# Patient Record
Sex: Male | Born: 1975
Health system: Southern US, Community
[De-identification: ages and names within clinical notes are randomized; demographics above are authoritative.]

## PROBLEM LIST (undated history)

## (undated) DIAGNOSIS — F419 Anxiety disorder, unspecified: Secondary | ICD-10-CM

## (undated) DIAGNOSIS — T8859XA Other complications of anesthesia, initial encounter: Secondary | ICD-10-CM

## (undated) DIAGNOSIS — T4145XA Adverse effect of unspecified anesthetic, initial encounter: Secondary | ICD-10-CM

## (undated) DIAGNOSIS — Z789 Other specified health status: Secondary | ICD-10-CM

## (undated) DIAGNOSIS — R112 Nausea with vomiting, unspecified: Secondary | ICD-10-CM

## (undated) DIAGNOSIS — Z9889 Other specified postprocedural states: Secondary | ICD-10-CM

## (undated) HISTORY — PX: APPENDECTOMY: SHX54

## (undated) HISTORY — DX: Anxiety disorder, unspecified: F41.9

## (undated) HISTORY — PX: WISDOM TOOTH EXTRACTION: SHX21

---

## 2012-04-14 ENCOUNTER — Emergency Department (HOSPITAL_BASED_OUTPATIENT_CLINIC_OR_DEPARTMENT_OTHER): Payer: Managed Care, Other (non HMO)

## 2012-04-14 ENCOUNTER — Encounter (HOSPITAL_BASED_OUTPATIENT_CLINIC_OR_DEPARTMENT_OTHER): Payer: Self-pay

## 2012-04-14 ENCOUNTER — Observation Stay (HOSPITAL_BASED_OUTPATIENT_CLINIC_OR_DEPARTMENT_OTHER)
Admission: EM | Admit: 2012-04-14 | Discharge: 2012-04-16 | Disposition: A | Discharge status: Home / Self Care | Payer: Managed Care, Other (non HMO) | Attending: General Surgery | Admitting: General Surgery

## 2012-04-14 DIAGNOSIS — R0609 Other forms of dyspnea: Secondary | ICD-10-CM | POA: Insufficient documentation

## 2012-04-14 DIAGNOSIS — K37 Unspecified appendicitis: Secondary | ICD-10-CM

## 2012-04-14 DIAGNOSIS — R42 Dizziness and giddiness: Secondary | ICD-10-CM | POA: Insufficient documentation

## 2012-04-14 DIAGNOSIS — R0989 Other specified symptoms and signs involving the circulatory and respiratory systems: Secondary | ICD-10-CM | POA: Insufficient documentation

## 2012-04-14 DIAGNOSIS — K3580 Unspecified acute appendicitis: Principal | ICD-10-CM | POA: Insufficient documentation

## 2012-04-14 HISTORY — DX: Adverse effect of unspecified anesthetic, initial encounter: T41.45XA

## 2012-04-14 HISTORY — DX: Other specified health status: Z78.9

## 2012-04-14 HISTORY — DX: Other specified postprocedural states: Z98.890

## 2012-04-14 HISTORY — DX: Other complications of anesthesia, initial encounter: T88.59XA

## 2012-04-14 HISTORY — DX: Nausea with vomiting, unspecified: R11.2

## 2012-04-14 LAB — CBC WITH DIFFERENTIAL/PLATELET
Basophils Absolute: 0.1 10*3/uL (ref 0.0–0.1)
HCT: 42 % (ref 39.0–52.0)
Lymphocytes Relative: 11 % — ABNORMAL LOW (ref 12–46)
Lymphs Abs: 1.9 10*3/uL (ref 0.7–4.0)
MCV: 90.3 fL (ref 78.0–100.0)
Monocytes Absolute: 1 10*3/uL (ref 0.1–1.0)
Neutro Abs: 14.2 10*3/uL — ABNORMAL HIGH (ref 1.7–7.7)
Platelets: 207 10*3/uL (ref 150–400)
RBC: 4.65 MIL/uL (ref 4.22–5.81)
RDW: 12.6 % (ref 11.5–15.5)
WBC: 17.2 10*3/uL — ABNORMAL HIGH (ref 4.0–10.5)

## 2012-04-14 LAB — URINALYSIS, ROUTINE W REFLEX MICROSCOPIC
Glucose, UA: NEGATIVE mg/dL
Hgb urine dipstick: NEGATIVE
Leukocytes, UA: NEGATIVE
Protein, ur: NEGATIVE mg/dL
Specific Gravity, Urine: 1.027 (ref 1.005–1.030)

## 2012-04-14 LAB — BASIC METABOLIC PANEL
CO2: 25 mEq/L (ref 19–32)
Chloride: 104 mEq/L (ref 96–112)
Glucose, Bld: 97 mg/dL (ref 70–99)
Sodium: 140 mEq/L (ref 135–145)

## 2012-04-14 LAB — HEPATIC FUNCTION PANEL
ALT: 24 U/L (ref 0–53)
Albumin: 4.4 g/dL (ref 3.5–5.2)
Alkaline Phosphatase: 47 U/L (ref 39–117)
Total Bilirubin: 0.2 mg/dL — ABNORMAL LOW (ref 0.3–1.2)
Total Protein: 7.3 g/dL (ref 6.0–8.3)

## 2012-04-14 MED ORDER — FAMOTIDINE IN NACL 20-0.9 MG/50ML-% IV SOLN
20.0000 mg | Freq: Once | INTRAVENOUS | Status: AC
  Administered 2012-04-14: 20 mg via INTRAVENOUS
  Filled 2012-04-14: qty 50

## 2012-04-14 MED ORDER — IOHEXOL 300 MG/ML  SOLN
20.0000 mL | Freq: Once | INTRAMUSCULAR | Status: AC | PRN
  Administered 2012-04-14: 20 mL via ORAL

## 2012-04-14 MED ORDER — HYDROMORPHONE HCL PF 1 MG/ML IJ SOLN
1.0000 mg | Freq: Once | INTRAMUSCULAR | Status: AC
  Administered 2012-04-14: 1 mg via INTRAVENOUS
  Filled 2012-04-14: qty 1

## 2012-04-14 MED ORDER — SODIUM CHLORIDE 0.9 % IV BOLUS (SEPSIS)
1000.0000 mL | Freq: Once | INTRAVENOUS | Status: AC
  Administered 2012-04-14: 1000 mL via INTRAVENOUS

## 2012-04-14 MED ORDER — IOHEXOL 300 MG/ML  SOLN
100.0000 mL | Freq: Once | INTRAMUSCULAR | Status: AC | PRN
  Administered 2012-04-14: 100 mL via INTRAVENOUS

## 2012-04-14 MED ORDER — ONDANSETRON HCL 4 MG/2ML IJ SOLN
4.0000 mg | Freq: Once | INTRAMUSCULAR | Status: AC
  Administered 2012-04-14: 4 mg via INTRAVENOUS
  Filled 2012-04-14: qty 2

## 2012-04-14 MED ORDER — SODIUM CHLORIDE 0.9 % IV SOLN
Freq: Once | INTRAVENOUS | Status: AC
  Administered 2012-04-15: 01:00:00 via INTRAVENOUS

## 2012-04-14 NOTE — ED Notes (Signed)
C/o abd pain and "swelling" x 1 hour-last po intake 4pm

## 2012-04-14 NOTE — ED Notes (Signed)
Pt reports developing rlq pain today, reports that his abdomen is swollen and his lower extremities appear swollen, no edema noted, + bowel sounds, last bm today. Pt reports vomiting once today.

## 2012-04-14 NOTE — ED Notes (Signed)
Patient transported to Ultrasound 

## 2012-04-14 NOTE — ED Notes (Signed)
MD at bedside. 

## 2012-04-15 ENCOUNTER — Encounter (HOSPITAL_COMMUNITY)
Admission: EM | Disposition: A | Discharge status: Home / Self Care | Payer: Self-pay | Source: Home / Self Care | Attending: Emergency Medicine

## 2012-04-15 ENCOUNTER — Encounter (HOSPITAL_COMMUNITY): Payer: Self-pay | Admitting: *Deleted

## 2012-04-15 ENCOUNTER — Observation Stay (HOSPITAL_COMMUNITY): Payer: Managed Care, Other (non HMO) | Admitting: Anesthesiology

## 2012-04-15 ENCOUNTER — Encounter (HOSPITAL_COMMUNITY): Payer: Self-pay | Admitting: Anesthesiology

## 2012-04-15 DIAGNOSIS — K37 Unspecified appendicitis: Secondary | ICD-10-CM | POA: Diagnosis present

## 2012-04-15 DIAGNOSIS — K3580 Unspecified acute appendicitis: Secondary | ICD-10-CM

## 2012-04-15 HISTORY — PX: LAPAROSCOPIC APPENDECTOMY: SHX408

## 2012-04-15 SURGERY — APPENDECTOMY, LAPAROSCOPIC
Anesthesia: General | Site: Abdomen | Wound class: Contaminated

## 2012-04-15 MED ORDER — ACETAMINOPHEN 650 MG RE SUPP: 650.0000 mg | Freq: Four times a day (QID) | RECTAL | Status: DC | PRN

## 2012-04-15 MED ORDER — CISATRACURIUM BESYLATE (PF) 10 MG/5ML IV SOLN
INTRAVENOUS | Status: DC | PRN
  Administered 2012-04-15: 10 mg via INTRAVENOUS

## 2012-04-15 MED ORDER — ALUM & MAG HYDROXIDE-SIMETH 200-200-20 MG/5ML PO SUSP: 30.0000 mL | Freq: Four times a day (QID) | ORAL | Status: DC | PRN

## 2012-04-15 MED ORDER — LIP MEDEX EX OINT
1.0000 "application " | TOPICAL_OINTMENT | Freq: Two times a day (BID) | CUTANEOUS | Status: DC
  Administered 2012-04-15 – 2012-04-16 (×2): 1 via TOPICAL
  Filled 2012-04-15: qty 7

## 2012-04-15 MED ORDER — ONDANSETRON HCL 4 MG/2ML IJ SOLN
INTRAMUSCULAR | Status: DC | PRN
  Administered 2012-04-15: 4 mg via INTRAVENOUS

## 2012-04-15 MED ORDER — PROMETHAZINE HCL 25 MG/ML IJ SOLN
12.5000 mg | Freq: Four times a day (QID) | INTRAMUSCULAR | Status: DC | PRN
  Filled 2012-04-15: qty 1

## 2012-04-15 MED ORDER — BUPIVACAINE-EPINEPHRINE 0.25% -1:200000 IJ SOLN
INTRAMUSCULAR | Status: DC | PRN
  Administered 2012-04-15: 50 mL

## 2012-04-15 MED ORDER — PROMETHAZINE HCL 25 MG/ML IJ SOLN
6.2500 mg | Freq: Four times a day (QID) | INTRAMUSCULAR | Status: DC | PRN
  Administered 2012-04-15: 6.25 mg via INTRAVENOUS
  Filled 2012-04-15: qty 1

## 2012-04-15 MED ORDER — LACTATED RINGERS IV SOLN
INTRAVENOUS | Status: DC | PRN
  Administered 2012-04-15: 06:00:00 via INTRAVENOUS

## 2012-04-15 MED ORDER — HYDROMORPHONE HCL PF 1 MG/ML IJ SOLN
INTRAMUSCULAR | Status: AC
  Filled 2012-04-15: qty 1

## 2012-04-15 MED ORDER — OXYCODONE HCL 5 MG PO TABS: 5.0000 mg | ORAL_TABLET | ORAL | Status: DC | PRN

## 2012-04-15 MED ORDER — MAGNESIUM HYDROXIDE 400 MG/5ML PO SUSP: 30.0000 mL | Freq: Two times a day (BID) | ORAL | Status: DC | PRN

## 2012-04-15 MED ORDER — DEXTROSE 5 % IV SOLN
2.0000 g | Freq: Four times a day (QID) | INTRAVENOUS | Status: DC
  Administered 2012-04-15: 2 g via INTRAVENOUS
  Filled 2012-04-15 (×2): qty 2

## 2012-04-15 MED ORDER — SUCCINYLCHOLINE CHLORIDE 20 MG/ML IJ SOLN
INTRAMUSCULAR | Status: DC | PRN
  Administered 2012-04-15: 140 mg via INTRAVENOUS

## 2012-04-15 MED ORDER — ACETAMINOPHEN 10 MG/ML IV SOLN
INTRAVENOUS | Status: AC
  Filled 2012-04-15: qty 100

## 2012-04-15 MED ORDER — BISACODYL 10 MG RE SUPP: 10.0000 mg | Freq: Two times a day (BID) | RECTAL | Status: DC | PRN

## 2012-04-15 MED ORDER — SODIUM CHLORIDE 0.9 % IJ SOLN: 3.0000 mL | INTRAMUSCULAR | Status: DC | PRN

## 2012-04-15 MED ORDER — HYDROMORPHONE HCL PF 1 MG/ML IJ SOLN
0.5000 mg | INTRAMUSCULAR | Status: DC | PRN
  Administered 2012-04-15: 1 mg via INTRAVENOUS
  Filled 2012-04-15: qty 1

## 2012-04-15 MED ORDER — SODIUM CHLORIDE 0.9 % IJ SOLN: 3.0000 mL | Freq: Two times a day (BID) | INTRAMUSCULAR | Status: DC

## 2012-04-15 MED ORDER — NEOSTIGMINE METHYLSULFATE 1 MG/ML IJ SOLN
INTRAMUSCULAR | Status: DC | PRN
  Administered 2012-04-15: 5 mg via INTRAVENOUS

## 2012-04-15 MED ORDER — HYDROMORPHONE HCL PF 1 MG/ML IJ SOLN
0.2500 mg | INTRAMUSCULAR | Status: DC | PRN
  Administered 2012-04-15 (×2): 0.25 mg via INTRAVENOUS
  Administered 2012-04-15: 0.5 mg via INTRAVENOUS

## 2012-04-15 MED ORDER — ONDANSETRON HCL 4 MG/2ML IJ SOLN
4.0000 mg | Freq: Four times a day (QID) | INTRAMUSCULAR | Status: DC | PRN
  Administered 2012-04-15 (×2): 4 mg via INTRAVENOUS
  Filled 2012-04-15 (×2): qty 2

## 2012-04-15 MED ORDER — DIPHENHYDRAMINE HCL 50 MG/ML IJ SOLN
12.5000 mg | Freq: Four times a day (QID) | INTRAMUSCULAR | Status: DC | PRN
Start: 2012-04-15 — End: 2012-04-16

## 2012-04-15 MED ORDER — HYDROMORPHONE HCL PF 1 MG/ML IJ SOLN
INTRAMUSCULAR | Status: AC
  Administered 2012-04-15: 1 mg via INTRAVENOUS
  Filled 2012-04-15: qty 1

## 2012-04-15 MED ORDER — LACTATED RINGERS IV BOLUS (SEPSIS): 1000.0000 mL | Freq: Three times a day (TID) | INTRAVENOUS | Status: DC | PRN

## 2012-04-15 MED ORDER — MIDAZOLAM HCL 5 MG/5ML IJ SOLN
INTRAMUSCULAR | Status: DC | PRN
  Administered 2012-04-15: 1.5 mg via INTRAVENOUS

## 2012-04-15 MED ORDER — ACETAMINOPHEN 10 MG/ML IV SOLN
1000.0000 mg | Freq: Four times a day (QID) | INTRAVENOUS | Status: DC
  Administered 2012-04-15 – 2012-04-16 (×3): 1000 mg via INTRAVENOUS
  Filled 2012-04-15 (×4): qty 100

## 2012-04-15 MED ORDER — ACETAMINOPHEN 325 MG PO TABS: 650.0000 mg | ORAL_TABLET | Freq: Four times a day (QID) | ORAL | Status: DC | PRN

## 2012-04-15 MED ORDER — BUPIVACAINE-EPINEPHRINE 0.25% -1:200000 IJ SOLN
INTRAMUSCULAR | Status: AC
  Filled 2012-04-15: qty 1

## 2012-04-15 MED ORDER — MAGIC MOUTHWASH
15.0000 mL | Freq: Four times a day (QID) | ORAL | Status: DC | PRN
  Filled 2012-04-15: qty 15

## 2012-04-15 MED ORDER — DEXTROSE IN LACTATED RINGERS 5 % IV SOLN
INTRAVENOUS | Status: DC
  Administered 2012-04-15 – 2012-04-16 (×4): via INTRAVENOUS

## 2012-04-15 MED ORDER — PROPOFOL 10 MG/ML IV EMUL
INTRAVENOUS | Status: DC | PRN
  Administered 2012-04-15: 200 mg via INTRAVENOUS

## 2012-04-15 MED ORDER — CEFOXITIN SODIUM-DEXTROSE 1-4 GM-% IV SOLR (PREMIX)
INTRAVENOUS | Status: AC
  Filled 2012-04-15: qty 100

## 2012-04-15 MED ORDER — IBUPROFEN 600 MG PO TABS
600.0000 mg | ORAL_TABLET | Freq: Three times a day (TID) | ORAL | Status: DC
  Administered 2012-04-15 – 2012-04-16 (×4): 600 mg via ORAL
  Filled 2012-04-15 (×8): qty 1

## 2012-04-15 MED ORDER — ACETAMINOPHEN 10 MG/ML IV SOLN
INTRAVENOUS | Status: DC | PRN
  Administered 2012-04-15: 1000 mg via INTRAVENOUS

## 2012-04-15 MED ORDER — PROMETHAZINE HCL 25 MG/ML IJ SOLN: 6.2500 mg | INTRAMUSCULAR | Status: DC | PRN

## 2012-04-15 MED ORDER — FENTANYL CITRATE 0.05 MG/ML IJ SOLN
INTRAMUSCULAR | Status: DC | PRN
  Administered 2012-04-15: 50 ug via INTRAVENOUS
  Administered 2012-04-15: 100 ug via INTRAVENOUS
  Administered 2012-04-15 (×2): 50 ug via INTRAVENOUS

## 2012-04-15 MED ORDER — DIPHENHYDRAMINE HCL 12.5 MG/5ML PO ELIX: 12.5000 mg | ORAL_SOLUTION | Freq: Four times a day (QID) | ORAL | Status: DC | PRN

## 2012-04-15 MED ORDER — GLYCOPYRROLATE 0.2 MG/ML IJ SOLN
INTRAMUSCULAR | Status: DC | PRN
  Administered 2012-04-15: .8 mg via INTRAVENOUS

## 2012-04-15 MED ORDER — LIDOCAINE HCL (CARDIAC) 20 MG/ML IV SOLN
INTRAVENOUS | Status: DC | PRN
  Administered 2012-04-15: 30 mg via INTRAVENOUS

## 2012-04-15 MED ORDER — DEXTROSE 5 % IV SOLN
2.0000 g | Freq: Three times a day (TID) | INTRAVENOUS | Status: DC
  Administered 2012-04-15 – 2012-04-16 (×4): 2 g via INTRAVENOUS
  Filled 2012-04-15 (×6): qty 2

## 2012-04-15 MED ORDER — HYDROMORPHONE HCL PF 1 MG/ML IJ SOLN
1.0000 mg | Freq: Once | INTRAMUSCULAR | Status: AC
  Administered 2012-04-15: 1 mg via INTRAVENOUS
  Filled 2012-04-15: qty 1

## 2012-04-15 MED ORDER — SODIUM CHLORIDE 0.9 % IV SOLN: 250.0000 mL | INTRAVENOUS | Status: DC | PRN

## 2012-04-15 MED ORDER — LACTATED RINGERS IV BOLUS (SEPSIS)
1000.0000 mL | Freq: Once | INTRAVENOUS | Status: AC
  Administered 2012-04-15: 1000 mL via INTRAVENOUS

## 2012-04-15 MED ORDER — OXYCODONE HCL 5 MG PO TABS
5.0000 mg | ORAL_TABLET | ORAL | Status: DC | PRN
  Administered 2012-04-16 (×2): 5 mg via ORAL
  Filled 2012-04-15 (×3): qty 1

## 2012-04-15 SURGICAL SUPPLY — 37 items
APPLIER CLIP 5 13 M/L LIGAMAX5 (MISCELLANEOUS)
APPLIER CLIP ROT 10 11.4 M/L (STAPLE)
CANISTER SUCTION 2500CC (MISCELLANEOUS) ×2 IMPLANT
CLIP APPLIE 5 13 M/L LIGAMAX5 (MISCELLANEOUS) IMPLANT
CLIP APPLIE ROT 10 11.4 M/L (STAPLE) IMPLANT
CLOTH BEACON ORANGE TIMEOUT ST (SAFETY) ×2 IMPLANT
CUTTER FLEX LINEAR 45M (STAPLE) ×2 IMPLANT
DECANTER SPIKE VIAL GLASS SM (MISCELLANEOUS) ×2 IMPLANT
DRAPE LAPAROSCOPIC ABDOMINAL (DRAPES) ×2 IMPLANT
DRSG TEGADERM 2-3/8X2-3/4 SM (GAUZE/BANDAGES/DRESSINGS) ×4 IMPLANT
DRSG TEGADERM 4X4.75 (GAUZE/BANDAGES/DRESSINGS) ×4 IMPLANT
ELECT REM PT RETURN 9FT ADLT (ELECTROSURGICAL) ×2
ELECTRODE REM PT RTRN 9FT ADLT (ELECTROSURGICAL) ×1 IMPLANT
ENDOLOOP SUT PDS II  0 18 (SUTURE) ×1
ENDOLOOP SUT PDS II 0 18 (SUTURE) ×1 IMPLANT
GLOVE BIOGEL PI IND STRL 7.0 (GLOVE) ×1 IMPLANT
GLOVE BIOGEL PI INDICATOR 7.0 (GLOVE) ×1
GLOVE ECLIPSE 8.0 STRL XLNG CF (GLOVE) ×4 IMPLANT
GLOVE INDICATOR 8.0 STRL GRN (GLOVE) ×4 IMPLANT
GOWN STRL NON-REIN LRG LVL3 (GOWN DISPOSABLE) ×6 IMPLANT
GOWN STRL REIN XL XLG (GOWN DISPOSABLE) ×4 IMPLANT
KIT BASIN OR (CUSTOM PROCEDURE TRAY) ×2 IMPLANT
NS IRRIG 1000ML POUR BTL (IV SOLUTION) ×2 IMPLANT
PENCIL BUTTON HOLSTER BLD 10FT (ELECTRODE) ×2 IMPLANT
POUCH SPECIMEN RETRIEVAL 10MM (ENDOMECHANICALS) IMPLANT
RELOAD 45 VASCULAR/THIN (ENDOMECHANICALS) IMPLANT
RELOAD STAPLE TA45 3.5 REG BLU (ENDOMECHANICALS) ×2 IMPLANT
SET IRRIG TUBING LAPAROSCOPIC (IRRIGATION / IRRIGATOR) ×2 IMPLANT
SOLUTION ANTI FOG 6CC (MISCELLANEOUS) ×2 IMPLANT
SUT MNCRL AB 4-0 PS2 18 (SUTURE) ×2 IMPLANT
TOWEL OR 17X26 10 PK STRL BLUE (TOWEL DISPOSABLE) ×2 IMPLANT
TRAY FOLEY CATH 14FRSI W/METER (CATHETERS) ×2 IMPLANT
TRAY LAP CHOLE (CUSTOM PROCEDURE TRAY) ×2 IMPLANT
TROCAR XCEL BLADELESS 5X75MML (TROCAR) ×2 IMPLANT
TROCAR Z-THREAD FIOS 12X100MM (TROCAR) ×2 IMPLANT
TROCAR Z-THREAD FIOS 5X100MM (TROCAR) ×2 IMPLANT
TUBING INSUFFLATION 10FT LAP (TUBING) ×2 IMPLANT

## 2012-04-15 NOTE — ED Provider Notes (Signed)
History     CSN: [id number]  Arrival date & time 04/14/12  1948   First MD Initiated Contact with Patient 04/14/12 2115      Chief Complaint  Patient presents with  . Abdominal Pain    (Consider location/radiation/quality/duration/timing/severity/associated sxs/prior treatment) HPI Comments: Pt comes in with cc of abdominal pain. Abd pain is sudden onset, started 2-3 hours ago prior to arrival and is located over the upper quadrants and is moving to the right flank. There was mild nausea, no emesis, no diarrhea, last BM was at 4 pm, non bloody. No hx of renal stones, gastritis, alcohol abuse, nsaid use. Pt;s pain is not radiating to the back. The pain is worse with coughing and movement, better with laying still.  Patient is a 36 y.o. male presenting with abdominal pain. The history is provided by the patient.  Abdominal Pain The primary symptoms of the illness include abdominal pain and nausea. The primary symptoms of the illness do not include fever, shortness of breath, vomiting or dysuria.  Symptoms associated with the illness do not include chills.    History reviewed. No pertinent past medical history.  History reviewed. No pertinent past surgical history.  No family history on file.  History  Substance Use Topics  . Smoking status: Never Smoker   . Smokeless tobacco: Not on file  . Alcohol Use: No      Review of Systems  Constitutional: Negative for fever, chills and activity change.  HENT: Negative for neck pain.   Eyes: Negative for visual disturbance.  Respiratory: Negative for cough, chest tightness and shortness of breath.   Cardiovascular: Negative for chest pain.  Gastrointestinal: Positive for nausea and abdominal pain. Negative for vomiting and abdominal distention.  Genitourinary: Negative for dysuria, enuresis and difficulty urinating.  Musculoskeletal: Negative for arthralgias.  Neurological: Positive for dizziness. Negative for light-headedness and  headaches.  Psychiatric/Behavioral: Negative for confusion.    Allergies  Review of patient's allergies indicates no known allergies.  Home Medications  No current outpatient prescriptions on file.  BP 155/78  Pulse 80  Temp 98.9 F (37.2 C) (Oral)  Resp 18  SpO2 100%  Physical Exam  Nursing note and vitals reviewed. Constitutional: He is oriented to person, place, and time. He appears well-developed.  HENT:  Head: Normocephalic and atraumatic.  Eyes: Conjunctivae normal and EOM are normal. Pupils are equal, round, and reactive to light.  Neck: Normal range of motion. Neck supple.  Cardiovascular: Normal rate, regular rhythm and normal heart sounds.   Pulmonary/Chest: Effort normal and breath sounds normal. No respiratory distress. He has no wheezes.  Abdominal: Soft. Bowel sounds are normal. He exhibits no distension. There is tenderness. There is no rebound and no guarding.       Diffuse abdominal tenderness, left upper quadrant, epigastrium, RUQ, CVA and periumbilical, with guarding, no rebound tenderness. Abd is soft, + BS.  Neurological: He is alert and oriented to person, place, and time.  Skin: Skin is warm.    ED Course  Procedures (including critical care time)  Labs Reviewed  CBC WITH DIFFERENTIAL - Abnormal; Notable for the following:    WBC 17.2 (*)     Neutrophils Relative 83 (*)     Neutro Abs 14.2 (*)     Lymphocytes Relative 11 (*)     All other components within normal limits  BASIC METABOLIC PANEL - Abnormal; Notable for the following:    GFR calc non Af Amer 86 (*)  All other components within normal limits  HEPATIC FUNCTION PANEL - Abnormal; Notable for the following:    Total Bilirubin 0.2 (*)     All other components within normal limits  URINALYSIS, ROUTINE W REFLEX MICROSCOPIC  LIPASE, BLOOD   US Abdomen Complete  04/14/2012  *RADIOLOGY REPORT*  Clinical Data:  36 year old male with sudden onset of epigastric pain.  COMPLETE ABDOMINAL  ULTRASOUND  Comparison:  None.  Findings:  Gallbladder:  No gallstones, gallbladder wall thickening, or pericholecystic fluid. No sonographic Murphy's sign elicited.  Common bile duct:  Normal measuring 2 mm diameter.  Liver:  No focal lesion identified.  Within normal limits in parenchymal echogenicity.  IVC:  Incompletely visualized due to overlying bowel gas, visualized portions within normal limits.  Pancreas:  Incompletely visualized due to overlying bowel gas, visualized portions within normal limits.  Spleen:  Normal measuring 8.6 cm in length.  Right Kidney:  Normal measuring 9.8 cm in length.  Left Kidney:  Normal measuring 9.8 cm in length.  Abdominal aorta:  Incompletely visualized due to overlying bowel gas, visualized portions within normal limits.  IMPRESSION: No cholelithiasis.  Negative abdominal ultrasound.   Original Report Authenticated By: Harley Hallmark, M.D.    Ct Abdomen Pelvis W Contrast  04/15/2012  *RADIOLOGY REPORT*  Clinical Data: Right lower quadrant pain.  Nausea, vomiting, diarrhea.  CT ABDOMEN AND PELVIS WITH CONTRAST  Technique:  Multidetector CT imaging of the abdomen and pelvis was performed following the standard protocol during bolus administration of intravenous contrast.  Contrast: 20mL OMNIPAQUE IOHEXOL 300 MG/ML  SOLN, OMNIPAQUE IOHEXOL 300 MG/ML  SOLN  Comparison: None.  Findings: Lung bases show atelectasis.  Liver, gallbladder, spleen, pancreas, adrenal glands and common bile duct appear normal. Stomach and proximal small bowel appear normal.  Small hiatal hernia.  Periappendiceal inflammatory changes are present near the tip of the appendix (image number 51 series 2).  There is no free fluid in the anatomic pelvis.  The colon is within normal limits.  No bowel obstruction.  Vasculature appears normal.  No aggressive osseous lesions.  IMPRESSION: Uncomplicated early acute appendicitis.   Original Report Authenticated By: Andreas Newport, M.D.    Dg Abd Acute  W/chest  04/14/2012  *RADIOLOGY REPORT*  Clinical Data: 36 year old male epigastric pain and vomiting.  ACUTE ABDOMEN SERIES (ABDOMEN 2 VIEW & CHEST 1 VIEW)  Comparison: None.  Findings: Somewhat shallow lung volumes.  The lungs are clear. Cardiac size and mediastinal contours are within normal limits. Visualized tracheal air column is within normal limits.  No pneumothorax or pneumoperitoneum.  Nonobstructed bowel gas pattern.  Abdominal and pelvic visceral contours are within normal limits. No osseous abnormality identified.  IMPRESSION: Nonobstructed bowel gas pattern, no free air. Negative chest.   Original Report Authenticated By: Harley Hallmark, M.D.      1. Appendicitis       MDM  DDx includes: Pancreatitis Hepatobiliary pathology including cholecystitis Gastritis/PUD SBO ACS syndrome Aortic Dissection Appendicitis Renal stones  Pt comes in with cc of sudden onset abd pain. Diffuse abd pain, mostly in the upper quadrants, but mildly present in the RLQ as well. The only non tender area is the LLQ and the left flank. Concerns are essentially for renal stones, cholecystitis, pancreatitis and appendicitis. He has no vascular risk factors. We will start with AAS, as he has some guarding, with hx suggestive of rebound tenderness. [place] and CT abd to follow.   1:00 AM Ct shows acute appendicitis, early onset.  Pt to be transferred to [facility] and Dr. Michaell Cowing to see him. Ed to Ed transfer, Dr. Michaell Cowing accepting Physician. Pt given AB, still npo. Aware of the disposition.           Derwood Kaplan, MD 04/15/12 0100

## 2012-04-15 NOTE — Op Note (Signed)
7:31 AM  PATIENT:  Timothy Torres  36 y.o. male  Patient has no care team.  PRE-OPERATIVE DIAGNOSIS:  acute appendicitis  POST-OPERATIVE DIAGNOSIS:  acute appendicitis  PROCEDURE:  Procedure(s): APPENDECTOMY LAPAROSCOPIC  SURGEON:  Surgeon(s): Ardeth Sportsman, MD  ASSISTANT: none   ANESTHESIA:   local and general  EBL:  Total I/O In: -  Out: 125 [Urine:75; Blood:50]  Delay start of Pharmacological VTE agent (>24hrs) due to surgical blood loss or risk of bleeding:  no  DRAINS: none   SPECIMEN:  Source of Specimen:  Appendix  DISPOSITION OF SPECIMEN:  PATHOLOGY  COUNTS:  YES  PLAN OF CARE: Admit for overnight observation  PATIENT DISPOSITION:  PACU - hemodynamically stable.   INDICATIONS: Patient with concerning symptoms & work up suspicious for appendicitis.  Surgery was recommended:  The anatomy & physiology of the digestive tract was discussed.  The pathophysiology of appendicitis was discussed.  Natural history risks without surgery was discussed.   I feel the risks of no intervention will lead to serious problems that outweigh the operative risks; therefore, I recommended diagnostic laparoscopy with removal of appendix to remove the pathology.  Laparoscopic & open techniques were discussed.   I noted a good likelihood this will help address the problem.    Risks such as bleeding, infection, abscess, leak, reoperation, possible ostomy, hernia, heart attack, death, and other risks were discussed.  Goals of post-operative recovery were discussed as well.  We will work to minimize complications.  Questions were answered.  The patient expresses understanding & wishes to proceed with surgery.  OR FINDINGS: Retrocecal/retro-colic appendix with inflammation and early phlegmon.  No major perforation.  No generalized peritonitis.  DESCRIPTION:   The patient was identified & brought into the operating room. The patient was positioned supine with arms tucked. SCDs were  active during the entire case. The patient underwent general anesthesia without any difficulty.  The abdomen was prepped and draped in a sterile fashion. A Surgical Timeout confirmed our plan.  I made a transverse incision through the inferior umbilical fold.  I made a small transverse nick through the infraumbilical fascia and confirmed peritoneal entry.  I placed a 5mm port.  We induced carbon dioxide insufflation.  Camera inspection revealed no injury.  I placed additional ports under direct laparoscopic visualization.  I mobilized the terminal ileum to proximal ascending colon in a lateral to medial fashion.  I took care to avoid injuring any retroperitoneal structures.   I freed the appendix off its attachments to the ascending colon and cecal mesentery.  I was able to free off the base of the appendix which was still viable.  I stapled the appendix off the cecum using a laparoscopic stapler. I took a healthy cuff viable cecum.   I elevated the appendix. I skeletonized & ligated the mesoappendix with cautery & an 0 PDS endoloop.  I did copious irrigation.  I placed the appendix inside an EndoCatch bag and removed out the 12 mm port.  Hemostasis was good in the mesoappendix, colon mesentery, and retroperitoneum. Staple line was intact on the cecum with no bleeding. I washed out the pelvis, retrohepatic space and right paracolic gutter. I washed out the left side as well.  Hemostasis is good. There was no perforation or injury.  Because the area cleaned up well after irrigation, I did not place a drain.  I aspirated the carbon dioxide. I removed the ports. I closed the 12 mm fascia site using a 0 Vicryl  stitch using a laparoscopic suture passer. I closed skin using 4-0 monocryl stitch.  Patient was extubated and sent to the recovery room.  I am about to discuss the operative findings with the patient's  family. I suspect the patient is going used in the hospital at least overnight and will need  antibiotics only 23hrs. Questions answered. They expressed understanding and appreciation.

## 2012-04-15 NOTE — Progress Notes (Signed)
Patient sleepy- easily aroused- SA02 93- AND DROPS DOWN TO 88- O2 mask reapplied at 10 liters per minute- SAO2 UP TO 96

## 2012-04-15 NOTE — ED Notes (Signed)
Report given to dianne, Consulting civil engineer at [employer] ed

## 2012-04-15 NOTE — H&P (Signed)
Timothy Torres  1976-02-28 [id number]  CARE TEAM:  PCP: No primary provider on file.  Outpatient Care Team: Patient has no care team.  Inpatient Treatment Team: Treatment Team: Attending Provider: Bishop Limbo, MD; Registered Nurse: Liborio Nixon, RN; Technician: Shade Flood, NT; Attending Physician: Bishop Limbo, MD   This patient is a 36 y.o.male who presents today for surgical evaluation at the request of Dr. Jeanmarie Plant, [facility] ED MD.   Reason for evaluation: Abdominal pain.  Probable appendicitis  Pleasant healthy male.  Noticed at work on second shift yesterday severe abdominal pain.  Became more right-sided.  Decreased appetite.  Nauseated.  Became more intense.  Went to the emergency room.  Pain seemed be more in the right upper quadrant.  Workup was negative for bowel obstruction or cholecystitis.  CT scan showed evidence of appendicitis with the tip flipped up into the right upper quadrant.  Based on concerns, the patient was transferred to [facility] for surgical consultation.    Vomited on the way over.  He comes today with his wife.  Patient notes the pain has decreased after getting IV antibiotics and narcotics & emesis.  No prior surgeries.  No history of inflammatory bowel disease or Irritable bowel syndrome.  No exertional chest pain.  No hematochezia or melena.  No history of kidney stones.  No hematuria.No problem with food intolerances in the past.  He works for a [employer], doing light/moderate activity but no heavy straining.  Due for a conference in [facility] in early October.  Patient Active Problem List  Diagnosis  . Appendicitis    History reviewed. No pertinent past medical history.  History reviewed. No pertinent past surgical history.  History   Social History  . Marital Status: Married    Spouse Name: N/A    Number of Children: N/A  . Years of Education: N/A   Occupational History  . Not on file.   Social History Main Topics    . Smoking status: Never Smoker   . Smokeless tobacco: Not on file  . Alcohol Use: No  . Drug Use: No  . Sexually Active:    Other Topics Concern  . Not on file   Social History Narrative  . No narrative on file    No family history on file.  Current Facility-Administered Medications  Medication Dose Route Frequency Provider Last Rate Last Dose  . 0.9 %  sodium chloride infusion   Intravenous Once Derwood Kaplan, MD 150 mL/hr at 04/15/12 0036    . cefOXitin (MEFOXIN) 2 g in dextrose 5 % 50 mL IVPB  2 g Intravenous Q6H Ankit Nanavati, MD   2 g at 04/15/12 0035  . famotidine (PEPCID) IVPB 20 mg  20 mg Intravenous Once Derwood Kaplan, MD   20 mg at 04/14/12 2155  . HYDROmorphone (DILAUDID) injection 1 mg  1 mg Intravenous Once Derwood Kaplan, MD   1 mg at 04/14/12 2153  . HYDROmorphone (DILAUDID) injection 1 mg  1 mg Intravenous Once Derwood Kaplan, MD   1 mg at 04/15/12 0225  . iohexol (OMNIPAQUE) 300 MG/ML solution 100 mL  100 mL Intravenous Once PRN Derwood Kaplan, MD   100 mL at 04/14/12 2354  . iohexol (OMNIPAQUE) 300 MG/ML solution 20 mL  20 mL Oral Once PRN Derwood Kaplan, MD   20 mL at 04/14/12 2354  . lactated ringers bolus 1,000 mL  1,000 mL Intravenous Once Derwood Kaplan, MD   1,000 mL at 04/15/12 0140  .  ondansetron (ZOFRAN) injection 4 mg  4 mg Intravenous Once Derwood Kaplan, MD   4 mg at 04/14/12 2152  . sodium chloride 0.9 % bolus 1,000 mL  1,000 mL Intravenous Once Derwood Kaplan, MD   1,000 mL at 04/14/12 2151   Current Outpatient Prescriptions  Medication Sig Dispense Refill  . ibuprofen (ADVIL,MOTRIN) 200 MG tablet Take 600 mg by mouth every 6 (six) hours as needed. Pain shoulders         No Known Allergies  ROS: Constitutional:  No fevers, chills, sweats.  Weight stable Eyes:  No vision changes, No discharge HENT:  No sore throats, nasal drainage Lymph: No neck swelling, No bruising easily Pulmonary:  No cough, productive sputum CV: No orthopnea, PND   Patient walks 60 minutes for about 2 miles without difficulty.  No exertional chest/neck/shoulder/arm pain.  GI:  No personal nor family history of GI/colon cancer, inflammatory bowel disease, irritable bowel syndrome, allergy such as Celiac Sprue, dietary/dairy problems, colitis, ulcers nor gastritis.  No recent sick contacts/gastroenteritis.  No travel outside the country.  No changes in diet.  Renal: No UTIs, No hematuria Genital:  No drainage, bleeding, masses Musculoskeletal: No severe joint pain.  Good ROM major joints Skin:  No sores or lesions.  No rashes Heme/Lymph:  No easy bleeding.  No swollen lymph nodes  BP 155/78  Pulse 80  Temp 98.9 F (37.2 C) (Oral)  Resp 18  SpO2 100%  Physical Exam: General: Pt awake/alert/oriented x4 in no major acute distress Eyes: PERRL, normal EOM. Sclera nonicteric Neuro: CN II-XII intact w/o focal sensory/motor deficits. Lymph: No head/neck/groin lymphadenopathy Psych:  No delerium/psychosis/paranoia HENT: Normocephalic, Mucus membranes moist.  No thrush Neck: Supple, No tracheal deviation Chest: No pain.  Good respiratory excursion. CV:  Pulses intact.  Regular rhythm Abdomen: Soft, Nondistended.  Mod TTP  R flank > RLQ.  No incarcerated hernias. Ext:  SCDs BLE.  No significant edema.  No cyanosis Skin: No petechiae / purpurae  Results:   Labs: Results for orders placed during the hospital encounter of 04/14/12 (from the past 48 hour(s))  URINALYSIS, ROUTINE W REFLEX MICROSCOPIC     Status: Normal   Collection Time   04/14/12  9:04 PM      Component Value Range Comment   Color, Urine YELLOW  YELLOW    APPearance CLEAR  CLEAR    Specific Gravity, Urine 1.027  1.005 - 1.030    pH 5.0  5.0 - 8.0    Glucose, UA NEGATIVE  NEGATIVE mg/dL    Hgb urine dipstick NEGATIVE  NEGATIVE    Bilirubin Urine NEGATIVE  NEGATIVE    Ketones, ur NEGATIVE  NEGATIVE mg/dL    Protein, ur NEGATIVE  NEGATIVE mg/dL    Urobilinogen, UA 0.2  0.0 - 1.0  mg/dL    Nitrite NEGATIVE  NEGATIVE    Leukocytes, UA NEGATIVE  NEGATIVE MICROSCOPIC NOT DONE ON URINES WITH NEGATIVE PROTEIN, BLOOD, LEUKOCYTES, NITRITE, OR GLUCOSE <1000 mg/dL.  CBC WITH DIFFERENTIAL     Status: Abnormal   Collection Time   04/14/12  9:35 PM      Component Value Range Comment   WBC 17.2 (*) 4.0 - 10.5 K/uL    RBC 4.65  4.22 - 5.81 MIL/uL    Hemoglobin 14.9  13.0 - 17.0 g/dL    HCT 16.1  09.6 - 04.5 %    MCV 90.3  78.0 - 100.0 fL    MCH 32.0  26.0 - 34.0 pg  MCHC 35.5  30.0 - 36.0 g/dL    RDW 16.1  09.6 - 04.5 %    Platelets 207  150 - 400 K/uL    Neutrophils Relative 83 (*) 43 - 77 %    Neutro Abs 14.2 (*) 1.7 - 7.7 K/uL    Lymphocytes Relative 11 (*) 12 - 46 %    Lymphs Abs 1.9  0.7 - 4.0 K/uL    Monocytes Relative 6  3 - 12 %    Monocytes Absolute 1.0  0.1 - 1.0 K/uL    Eosinophils Relative 0  0 - 5 %    Eosinophils Absolute 0.1  0.0 - 0.7 K/uL    Basophils Relative 0  0 - 1 %    Basophils Absolute 0.1  0.0 - 0.1 K/uL   BASIC METABOLIC PANEL     Status: Abnormal   Collection Time   04/14/12  9:35 PM      Component Value Range Comment   Sodium 140  135 - 145 mEq/L    Potassium 3.6  3.5 - 5.1 mEq/L    Chloride 104  96 - 112 mEq/L    CO2 25  19 - 32 mEq/L    Glucose, Bld 97  70 - 99 mg/dL    BUN 11  6 - 23 mg/dL    Creatinine, Ser 4.09  0.50 - 1.35 mg/dL    Calcium 9.5  8.4 - 81.1 mg/dL    GFR calc non Af Amer 86 (*) >90 mL/min    GFR calc Af Amer >90  >90 mL/min   HEPATIC FUNCTION PANEL     Status: Abnormal   Collection Time   04/14/12  9:35 PM      Component Value Range Comment   Total Protein 7.3  6.0 - 8.3 g/dL    Albumin 4.4  3.5 - 5.2 g/dL    AST 21  0 - 37 U/L    ALT 24  0 - 53 U/L    Alkaline Phosphatase 47  39 - 117 U/L    Total Bilirubin 0.2 (*) 0.3 - 1.2 mg/dL    Bilirubin, Direct <9.1  0.0 - 0.3 mg/dL    Indirect Bilirubin NOT CALCULATED  0.3 - 0.9 mg/dL   LIPASE, BLOOD     Status: Normal   Collection Time   04/14/12  9:35 PM       Component Value Range Comment   Lipase 24  11 - 59 U/L     Imaging / Studies: US Abdomen Complete  04/14/2012  *RADIOLOGY REPORT*  Clinical Data:  36 year old male with sudden onset of epigastric pain.  COMPLETE ABDOMINAL ULTRASOUND  Comparison:  None.  Findings:  Gallbladder:  No gallstones, gallbladder wall thickening, or pericholecystic fluid. No sonographic Murphy's sign elicited.  Common bile duct:  Normal measuring 2 mm diameter.  Liver:  No focal lesion identified.  Within normal limits in parenchymal echogenicity.  IVC:  Incompletely visualized due to overlying bowel gas, visualized portions within normal limits.  Pancreas:  Incompletely visualized due to overlying bowel gas, visualized portions within normal limits.  Spleen:  Normal measuring 8.6 cm in length.  Right Kidney:  Normal measuring 9.8 cm in length.  Left Kidney:  Normal measuring 9.8 cm in length.  Abdominal aorta:  Incompletely visualized due to overlying bowel gas, visualized portions within normal limits.  IMPRESSION: No cholelithiasis.  Negative abdominal ultrasound.   Original Report Authenticated By: Harley Hallmark, M.D.    Ct Abdomen  Pelvis W Contrast  04/15/2012  *RADIOLOGY REPORT*  Clinical Data: Right lower quadrant pain.  Nausea, vomiting, diarrhea.  CT ABDOMEN AND PELVIS WITH CONTRAST  Technique:  Multidetector CT imaging of the abdomen and pelvis was performed following the standard protocol during bolus administration of intravenous contrast.  Contrast: 20mL OMNIPAQUE IOHEXOL 300 MG/ML  SOLN, OMNIPAQUE IOHEXOL 300 MG/ML  SOLN  Comparison: None.  Findings: Lung bases show atelectasis.  Liver, gallbladder, spleen, pancreas, adrenal glands and common bile duct appear normal. Stomach and proximal small bowel appear normal.  Small hiatal hernia.  Periappendiceal inflammatory changes are present near the tip of the appendix (image number 51 series 2).  There is no free fluid in the anatomic pelvis.  The colon is within  normal limits.  No bowel obstruction.  Vasculature appears normal.  No aggressive osseous lesions.  IMPRESSION: Uncomplicated early acute appendicitis.   Original Report Authenticated By: Andreas Newport, M.D.    Dg Abd Acute W/chest  04/14/2012  *RADIOLOGY REPORT*  Clinical Data: 36 year old male epigastric pain and vomiting.  ACUTE ABDOMEN SERIES (ABDOMEN 2 VIEW & CHEST 1 VIEW)  Comparison: None.  Findings: Somewhat shallow lung volumes.  The lungs are clear. Cardiac size and mediastinal contours are within normal limits. Visualized tracheal air column is within normal limits.  No pneumothorax or pneumoperitoneum.  Nonobstructed bowel gas pattern.  Abdominal and pelvic visceral contours are within normal limits. No osseous abnormality identified.  IMPRESSION: Nonobstructed bowel gas pattern, no free air. Negative chest.   Original Report Authenticated By: Harley Hallmark, M.D.     Medications / Allergies: per chart  Antibiotics: Anti-infectives     Start     Dose/Rate Route Frequency Ordered Stop   04/15/12 0030   cefOXitin (MEFOXIN) 2 g in dextrose 5 % 50 mL IVPB        2 g 100 mL/hr over 30 Minutes Intravenous 4 times per day 04/15/12 0026            Assessment  Timothy Torres  36 y.o. male       Problem List:  Active Problems:  Appendicitis  Appendicitis  Plan:  Admit IV ABX Lap exploration with appy:  The anatomy & physiology of the digestive tract was discussed.  The pathophysiology of appendicitis was discussed.  Natural history risks without surgery was discussed.   I feel the risks of no intervention will lead to serious problems that outweigh the operative risks; therefore, I recommended diagnostic laparoscopy with removal of appendix to remove the pathology.  Laparoscopic & open techniques were discussed.   I noted a good likelihood this will help address the problem.    Risks such as bleeding, infection, abscess, leak, reoperation, possible ostomy, hernia, heart  attack, death, and other risks were discussed.  Goals of post-operative recovery were discussed as well.  We will work to minimize complications.  Questions were answered.  The patient expresses understanding & wishes to proceed with surgery.   -VTE prophylaxis- SCDs, etc -mobilize as tolerated to help recovery  Ardeth Sportsman, M.D., F.A.C.S. Gastrointestinal and Minimally Invasive Surgery [facility] [address] [place], [place] [address] [phone] Main / Paging [phone] Voice Mail   04/15/2012

## 2012-04-15 NOTE — Progress Notes (Signed)
Observation review is complete. 

## 2012-04-15 NOTE — Discharge Summary (Signed)
Physician Discharge Summary  Patient ID: Timothy Torres MRN: [id number] DOB/AGE: September 06, 1975 36 y.o.  Admit date: 04/14/2012 Discharge date: 04/16/2012  Admission Diagnoses: acute appendicitis   Discharge Diagnoses: same Active Problems:  Appendicitis Possible sleep apnea.  PROCEDURES:  APPENDECTOMY LAPAROSCOPIC, 04/15/2012, Ardeth Sportsman, MD    Hospital Course: Timothy Torres. Noticed at work on second shift yesterday severe abdominal pain. Became more right-sided. Decreased appetite. Nauseated. Became more intense. Went to the emergency room. Pain seemed be more in the right upper quadrant. Workup was negative for bowel obstruction or cholecystitis. CT scan showed evidence of appendicitis with the tip flipped up into the right upper quadrant. Based on concerns, the patient was transferred to [facility] for surgical consultation.  Vomited on the way over. He comes today with his wife. Patient notes the pain has decreased after getting IV antibiotics and narcotics & emesis. No prior surgeries. No history of inflammatory bowel disease or Irritable bowel syndrome. No exertional chest pain. No hematochezia or melena. No history of kidney stones. No hematuria.No problem with food intolerances in the past. He works for a [employer], doing light/moderate activity but no heavy straining. Due for a conference in [facility] in early October. He was taken to the OR on 9/25 at 7AM.  Post op had some nausea and pain issues.  He was better the following AM.  He is reported to desaturate some and wife confirms snoring and occasionally holding breath while sleeping.  We discussed weight loss and follow up for possible sleep apnea.  He will be seen in the [facility] clinic or by Dr.Gross in 2 weeks.   Disposition: Final discharge disposition not confirmed     Medication List     As of 04/16/2012 11:37 AM    TAKE these medications         acetaminophen 325 MG tablet   Commonly  known as: TYLENOL   Take 2 tablets (650 mg total) by mouth every 6 (six) hours as needed (or Temp > 100).      ibuprofen 200 MG tablet   Commonly known as: ADVIL,MOTRIN   Take 600 mg by mouth every 6 (six) hours as needed. Pain shoulders      oxyCODONE 5 MG immediate release tablet   Commonly known as: Oxy IR/ROXICODONE   Take 1-2 tablets (5-10 mg total) by mouth every 4 (four) hours as needed for pain.      oxyCODONE 5 MG immediate release tablet   Commonly known as: Oxy IR/ROXICODONE   Take 1-2 tablets (5-10 mg total) by mouth every 4 (four) hours as needed.        Follow-up Information    Follow up with [facility]. Schedule an appointment as soon as possible for a visit in 3 weeks. (Call for an appointment in 2-3 weeks with D[facility] clinic or DR. Gross.)    Contact information:   [address] [place] [address] [phone]      Please follow up. (No lifting over 20 pounds for 4 weeks.  You may return to work on 04/20/12.)          Signed: Sherrie George 04/16/2012, 11:37 AM

## 2012-04-15 NOTE — ED Notes (Signed)
[id number]<WG> Expected date:<BR> Expected time:<BR> Means of arrival:<BR> Comments:<BR> Transfer from [facility]

## 2012-04-15 NOTE — Anesthesia Postprocedure Evaluation (Signed)
  Anesthesia Post-op Note  Patient: Timothy Torres  Procedure(s) Performed: Procedure(s) (LRB): APPENDECTOMY LAPAROSCOPIC (N/A)  Patient Location: PACU  Anesthesia Type: General  Level of Consciousness: awake and alert   Airway and Oxygen Therapy: Patient Spontanous Breathing  Post-op Pain: mild  Post-op Assessment: Post-op Vital signs reviewed, Patient's Cardiovascular Status Stable, Respiratory Function Stable, Patent Airway and No signs of Nausea or vomiting  Post-op Vital Signs: stable  Complications: No apparent anesthesia complications

## 2012-04-15 NOTE — Transfer of Care (Signed)
Immediate Anesthesia Transfer of Care Note  Patient: Timothy Torres  Procedure(s) Performed: Procedure(s) (LRB) with comments: APPENDECTOMY LAPAROSCOPIC (N/A)  Patient Location: PACU  Anesthesia Type: General  Level of Consciousness: sedated  Airway & Oxygen Therapy: Patient Spontanous Breathing and Patient connected to face mask oxygen  Post-op Assessment: Report given to PACU RN and Post -op Vital signs reviewed and stable  Post vital signs: Reviewed and stable  Complications: No apparent anesthesia complications

## 2012-04-15 NOTE — Anesthesia Preprocedure Evaluation (Signed)
Anesthesia Evaluation  Patient identified by MRN, date of birth, ID band Patient awake    Reviewed: Allergy & Precautions, H&P , NPO status , Patient's Chart, lab work & pertinent test results, reviewed documented beta blocker date and time   Airway Mallampati: II TM Distance: >3 FB Neck ROM: full    Dental No notable dental hx.    Pulmonary neg pulmonary ROS,  breath sounds clear to auscultation  Pulmonary exam normal       Cardiovascular Exercise Tolerance: Good negative cardio ROS  Rhythm:regular Rate:Normal     Neuro/Psych negative neurological ROS  negative psych ROS   GI/Hepatic negative GI ROS, Neg liver ROS,   Endo/Other  negative endocrine ROS  Renal/GU negative Renal ROS  negative genitourinary   Musculoskeletal   Abdominal   Peds  Hematology negative hematology ROS (+)   Anesthesia Other Findings   Reproductive/Obstetrics negative OB ROS                           Anesthesia Physical Anesthesia Plan  ASA: II and Emergent  Anesthesia Plan: General ETT   Post-op Pain Management:    Induction:   Airway Management Planned:   Additional Equipment:   Intra-op Plan:   Post-operative Plan:   Informed Consent: I have reviewed the patients History and Physical, chart, labs and discussed the procedure including the risks, benefits and alternatives for the proposed anesthesia with the patient or authorized representative who has indicated his/her understanding and acceptance.   Dental Advisory Given  Plan Discussed with: CRNA  Anesthesia Plan Comments:         Anesthesia Quick Evaluation

## 2012-04-15 NOTE — ED Notes (Signed)
Dr. Karie Soda, general surgeon, at bedside.

## 2012-04-16 ENCOUNTER — Encounter (HOSPITAL_COMMUNITY): Payer: Self-pay | Admitting: Surgery

## 2012-04-16 LAB — BASIC METABOLIC PANEL
Calcium: 8.6 mg/dL (ref 8.4–10.5)
GFR calc Af Amer: >90 mL/min (ref >90)
GFR calc non Af Amer: >90 mL/min (ref >90)
Potassium: 3.5 mEq/L (ref 3.5–5.1)
Sodium: 138 mEq/L (ref 135–145)

## 2012-04-16 LAB — CBC
MCH: 31.9 pg (ref 26.0–34.0)
MCHC: 34.4 g/dL (ref 30.0–36.0)
RDW: 12.8 % (ref 11.5–15.5)

## 2012-04-16 MED ORDER — OXYCODONE HCL 5 MG PO TABS: 5.0000 mg | ORAL_TABLET | ORAL | Status: DC | PRN

## 2012-04-16 MED ORDER — ACETAMINOPHEN 325 MG PO TABS: 650.0000 mg | ORAL_TABLET | Freq: Four times a day (QID) | ORAL | Status: DC | PRN

## 2012-04-16 NOTE — Discharge Summary (Signed)
Initial ileus post appy. Doing better.  Follow up with Dr. Michaell Cowing.

## 2012-04-16 NOTE — Progress Notes (Signed)
1 Day Post-Op  Subjective: Still sore, but doing better this AM.  Plan to send home this AM.  Objective: Vital signs in last 24 hours: Temp:  [98.6 F (37 C)-100.7 F (38.2 C)] 98.7 F (37.1 C) (09/26 0424) Pulse Rate:  [65-91] 91  (09/26 0424) Resp:  [15-16] 16  (09/26 0424) BP: (117-154)/(68-94) 117/73 mmHg (09/26 0424) SpO2:  [86 %-95 %] 95 % (09/26 0424) Weight:  [91.2 kg (201 lb 1 oz)] 91.2 kg (201 lb 1 oz) (09/25 1454)  TM 100.7, VSS, labs are normal  Intake/Output from previous day: 09/25 0701 - 09/26 0700 In: 3496.3 [I.V.:3496.3] Out: 950 [Urine:900; Blood:50] Intake/Output this shift:    General appearance: alert, cooperative and no distress Resp: clear to auscultation bilaterally and nurses said he was desaturating.  ? sleep apnea. GI: soft, tender, incisions look fine.  tolerating PO's well.  Lab Results:   Basename 04/16/12 0805 04/14/12 2135  WBC 10.3 17.2*  HGB 13.4 14.9  HCT 39.0 42.0  PLT 187 207    BMET  Basename 04/16/12 0805 04/14/12 2135  NA 138 140  K 3.5 3.6  CL 103 104  CO2 28 25  GLUCOSE 124* 97  BUN 5* 11  CREATININE 0.92 1.10  CALCIUM 8.6 9.5   PT/INR No results found for this basename: LABPROT:2,INR:2 in the last 72 hours   Lab 04/14/12 2135  AST 21  ALT 24  ALKPHOS 47  BILITOT 0.2*  PROT 7.3  ALBUMIN 4.4     Lipase     Component Value Date/Time   LIPASE 24 04/14/2012 2135     Studies/Results: US Abdomen Complete  04/14/2012  *RADIOLOGY REPORT*  Clinical Data:  36 year old male with sudden onset of epigastric pain.  COMPLETE ABDOMINAL ULTRASOUND  Comparison:  None.  Findings:  Gallbladder:  No gallstones, gallbladder wall thickening, or pericholecystic fluid. No sonographic Murphy's sign elicited.  Common bile duct:  Normal measuring 2 mm diameter.  Liver:  No focal lesion identified.  Within normal limits in parenchymal echogenicity.  IVC:  Incompletely visualized due to overlying bowel gas, visualized portions within  normal limits.  Pancreas:  Incompletely visualized due to overlying bowel gas, visualized portions within normal limits.  Spleen:  Normal measuring 8.6 cm in length.  Right Kidney:  Normal measuring 9.8 cm in length.  Left Kidney:  Normal measuring 9.8 cm in length.  Abdominal aorta:  Incompletely visualized due to overlying bowel gas, visualized portions within normal limits.  IMPRESSION: No cholelithiasis.  Negative abdominal ultrasound.   Original Report Authenticated By: Harley Hallmark, M.D.    Ct Abdomen Pelvis W Contrast  04/15/2012  *RADIOLOGY REPORT*  Clinical Data: Right lower quadrant pain.  Nausea, vomiting, diarrhea.  CT ABDOMEN AND PELVIS WITH CONTRAST  Technique:  Multidetector CT imaging of the abdomen and pelvis was performed following the standard protocol during bolus administration of intravenous contrast.  Contrast: 20mL OMNIPAQUE IOHEXOL 300 MG/ML  SOLN, OMNIPAQUE IOHEXOL 300 MG/ML  SOLN  Comparison: None.  Findings: Lung bases show atelectasis.  Liver, gallbladder, spleen, pancreas, adrenal glands and common bile duct appear normal. Stomach and proximal small bowel appear normal.  Small hiatal hernia.  Periappendiceal inflammatory changes are present near the tip of the appendix (image number 51 series 2).  There is no free fluid in the anatomic pelvis.  The colon is within normal limits.  No bowel obstruction.  Vasculature appears normal.  No aggressive osseous lesions.  IMPRESSION: Uncomplicated early acute appendicitis.  Original Report Authenticated By: Andreas Newport, M.D.    Dg Abd Acute W/chest  04/14/2012  *RADIOLOGY REPORT*  Clinical Data: 36 year old male epigastric pain and vomiting.  ACUTE ABDOMEN SERIES (ABDOMEN 2 VIEW & CHEST 1 VIEW)  Comparison: None.  Findings: Somewhat shallow lung volumes.  The lungs are clear. Cardiac size and mediastinal contours are within normal limits. Visualized tracheal air column is within normal limits.  No pneumothorax or  pneumoperitoneum.  Nonobstructed bowel gas pattern.  Abdominal and pelvic visceral contours are within normal limits. No osseous abnormality identified.  IMPRESSION: Nonobstructed bowel gas pattern, no free air. Negative chest.   Original Report Authenticated By: Harley Hallmark, M.D.     Medications:    . acetaminophen  1,000 mg Intravenous Q6H  . cefOXitin  2 g Intravenous Q8H  . HYDROmorphone      . ibuprofen  600 mg Oral TID WC & HS  . lip balm  1 application Topical BID  . sodium chloride  3 mL Intravenous Q12H    Assessment/Plan acute appendicitis  APPENDECTOMY LAPAROSCOPIC, 04/15/2012, Ardeth Sportsman, MD   ? Sleep apnea.  Wife confirms he snores, and holds his breath at times.  Discussed sleep apnea.  He has lost a good deal of weight and plans to continue the trend.  Plan:  Home today, follow up in 2 weeks         LOS: 2 days    Verley Pariseau 04/16/2012

## 2012-04-16 NOTE — Progress Notes (Signed)
Doing better.   D/c home.

## 2012-04-20 ENCOUNTER — Other Ambulatory Visit (INDEPENDENT_AMBULATORY_CARE_PROVIDER_SITE_OTHER): Payer: Self-pay | Admitting: Surgery

## 2012-04-20 ENCOUNTER — Ambulatory Visit (INDEPENDENT_AMBULATORY_CARE_PROVIDER_SITE_OTHER): Payer: Managed Care, Other (non HMO) | Admitting: Surgery

## 2012-04-20 ENCOUNTER — Encounter (INDEPENDENT_AMBULATORY_CARE_PROVIDER_SITE_OTHER): Payer: Self-pay | Admitting: Surgery

## 2012-04-20 VITALS — BP 136/90 | HR 76 | Temp 98.9°F | Resp 18 | Ht 66.0 in | Wt 184.2 lb

## 2012-04-20 DIAGNOSIS — R109 Unspecified abdominal pain: Secondary | ICD-10-CM

## 2012-04-20 DIAGNOSIS — Z09 Encounter for follow-up examination after completed treatment for conditions other than malignant neoplasm: Secondary | ICD-10-CM

## 2012-04-20 LAB — CBC WITH DIFFERENTIAL/PLATELET
Basophils Relative: 1 % (ref 0–1)
Eosinophils Absolute: 0.1 10*3/uL (ref 0.0–0.7)
Hemoglobin: 15.9 g/dL (ref 13.0–17.0)
Lymphs Abs: 1.8 10*3/uL (ref 0.7–4.0)
MCH: 32.1 pg (ref 26.0–34.0)
MCHC: 36.8 g/dL — ABNORMAL HIGH (ref 30.0–36.0)
Monocytes Relative: 6 % (ref 3–12)
Neutro Abs: 4.4 10*3/uL (ref 1.7–7.7)
Neutrophils Relative %: 64 % (ref 43–77)
Platelets: 315 10*3/uL (ref 150–400)
RBC: 4.96 MIL/uL (ref 4.22–5.81)

## 2012-04-20 LAB — BASIC METABOLIC PANEL
Calcium: 9.8 mg/dL (ref 8.4–10.5)
Glucose, Bld: 86 mg/dL (ref 70–99)
Potassium: 4.7 mEq/L (ref 3.5–5.3)
Sodium: 140 mEq/L (ref 135–145)

## 2012-04-20 LAB — URINALYSIS, ROUTINE W REFLEX MICROSCOPIC
Glucose, UA: NEGATIVE mg/dL
Hgb urine dipstick: NEGATIVE
Leukocytes, UA: NEGATIVE
Nitrite: NEGATIVE
Specific Gravity, Urine: 1.025 (ref 1.005–1.030)
pH: 7.5 (ref 5.0–8.0)

## 2012-04-20 NOTE — Progress Notes (Signed)
Subjective:     Patient ID: Timothy Torres, male   DOB: 29-Apr-1976, 36 y.o.   MRN: [id number]  HPI This gentleman had a laparoscopic appendectomy performed last week by Dr. Michaell Cowing.  He has not had a bowel movement since surgery and has been having significant discomfort in his left groin at the trocar site. He denies fevers. He is eating well. He has stopped the narcotics and is taking only Tylenol and ibuprofen  Review of Systems     Objective:   Physical Exam On exam, his abdomen is soft. There is no erythema or ecchymosis at his incisions and I could feel no hematoma. There is mild tenderness in the left lower quadrant which seems muscular    Assessment:     Patient status post lap appendectomy.    Plan:     I believe he may have a slight hematoma in the muscle that is causing his discomfort. I do not believe he has an intra-abdominal process going on. His labs are pending and I will call me if there are any significant abnormalities. He is going to continue ibuprofen and Tylenol as well as start MiraLAX. He will keep his appointment on the 14th and less he gets worse.

## 2012-05-04 ENCOUNTER — Encounter (INDEPENDENT_AMBULATORY_CARE_PROVIDER_SITE_OTHER): Payer: Managed Care, Other (non HMO) | Admitting: Surgery

## 2012-06-01 ENCOUNTER — Encounter (INDEPENDENT_AMBULATORY_CARE_PROVIDER_SITE_OTHER): Payer: Managed Care, Other (non HMO) | Admitting: Surgery

## 2013-10-21 ENCOUNTER — Telehealth: Payer: Self-pay | Admitting: Family Medicine

## 2013-10-21 NOTE — Telephone Encounter (Signed)
appt given for thurs with bill 

## 2013-10-28 ENCOUNTER — Ambulatory Visit: Payer: Self-pay | Admitting: Family Medicine

## 2014-02-13 IMAGING — CT CT ABD-PELV W/ CM
2 of 4 series · 17 of 46 positions shown, 19 images · IV contrast (APPLIED)
Comparison: None.

CLINICAL DATA: Right lower quadrant pain.  Nausea, vomiting,
diarrhea.

CT ABDOMEN AND PELVIS WITH CONTRAST
TECHNIQUE: Multidetector CT imaging of the abdomen and pelvis was
performed following the standard protocol during bolus
administration of intravenous contrast.
Contrast: 20mL OMNIPAQUE IOHEXOL 300 MG/ML  SOLN, 100mL OMNIPAQUE
IOHEXOL 300 MG/ML  SOLN

[Series 2: abd/pelvis 5.0 b31f · axial · 0.76mm/px · z∈[-408,+32]mm · 14 of 96 slices shown, 16 images]
[im 4/96  soft-tissue]
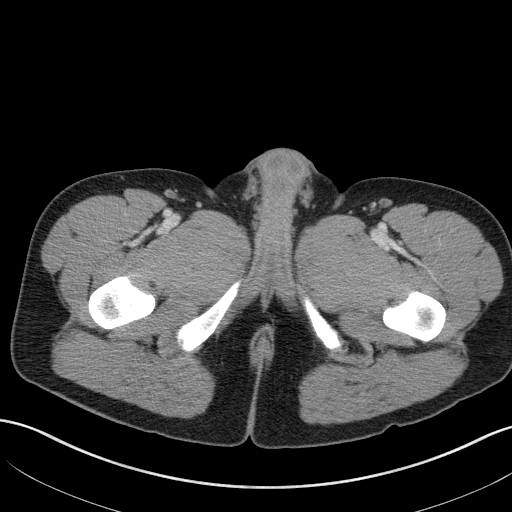
[im 4/96  bone]
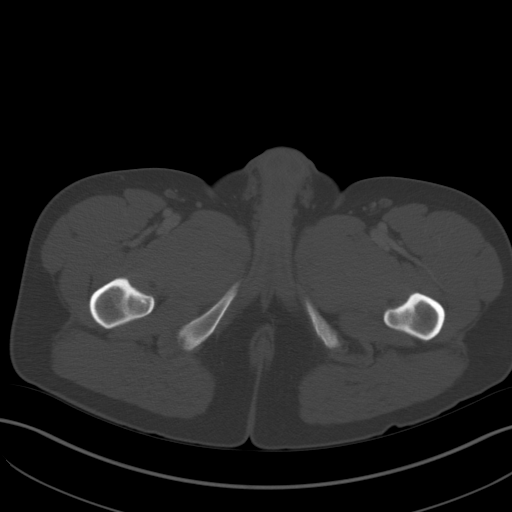
[im 12/96  soft-tissue]
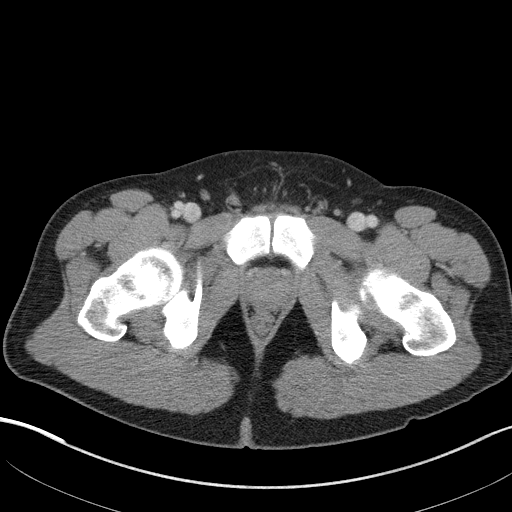
[im 20/96  soft-tissue]
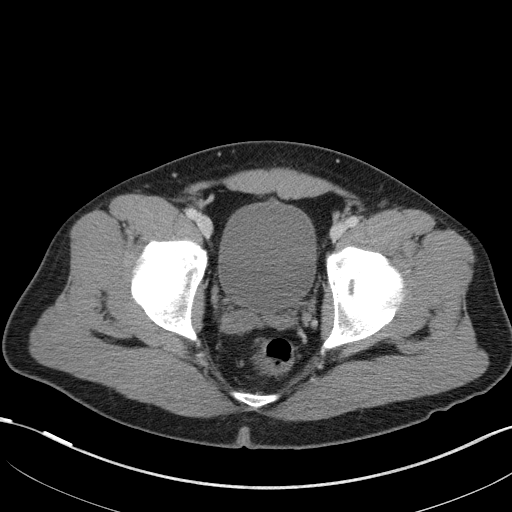
[im 24/96  soft-tissue]
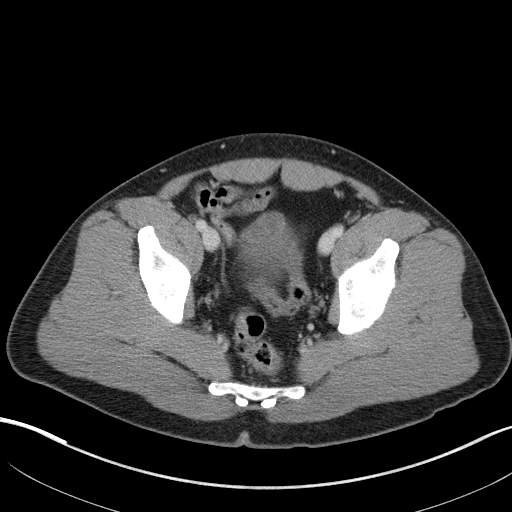
[im 32/96  soft-tissue]
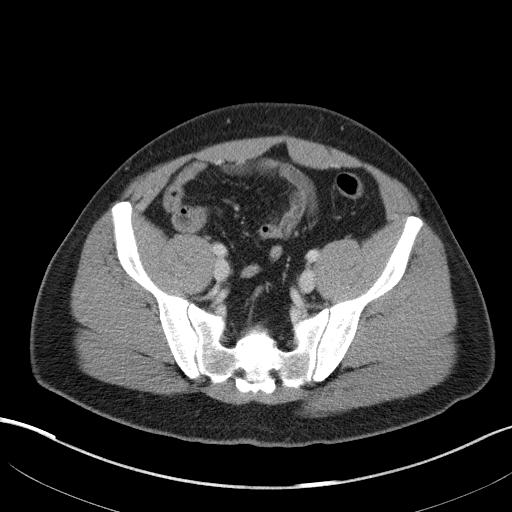
[im 40/96  soft-tissue]
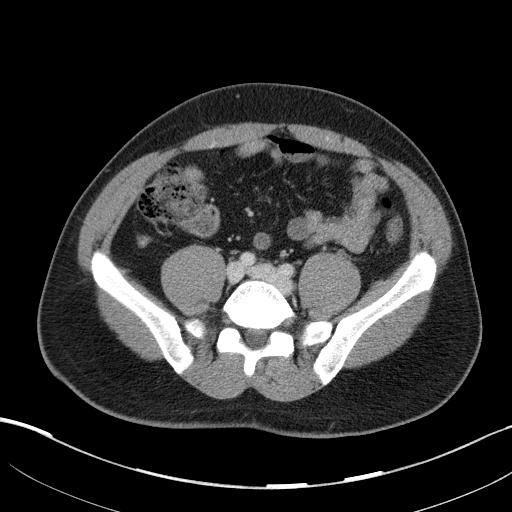
[im 44/96  soft-tissue]
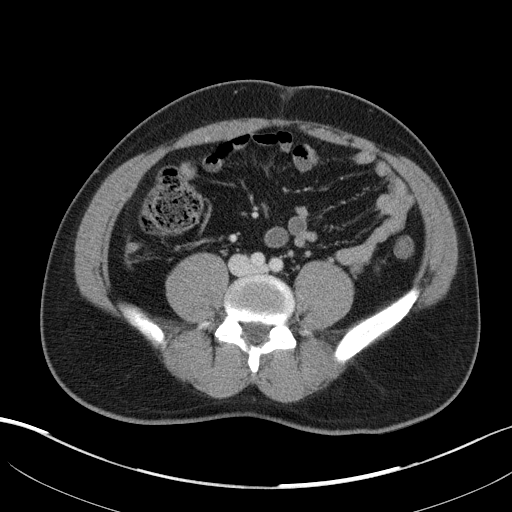
[im 52/96  soft-tissue]
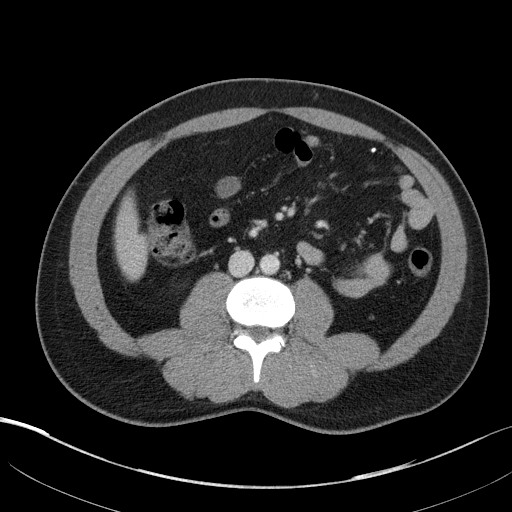
[im 56/96  soft-tissue]
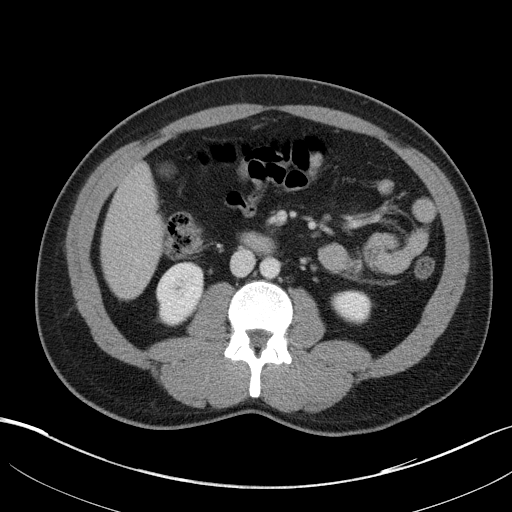
[im 56/96  bone]
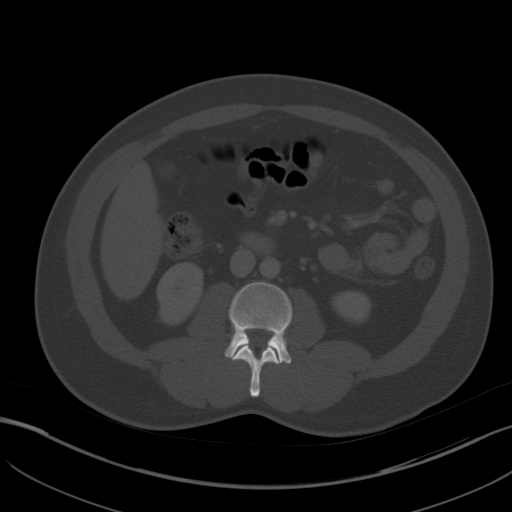
[im 64/96  soft-tissue]
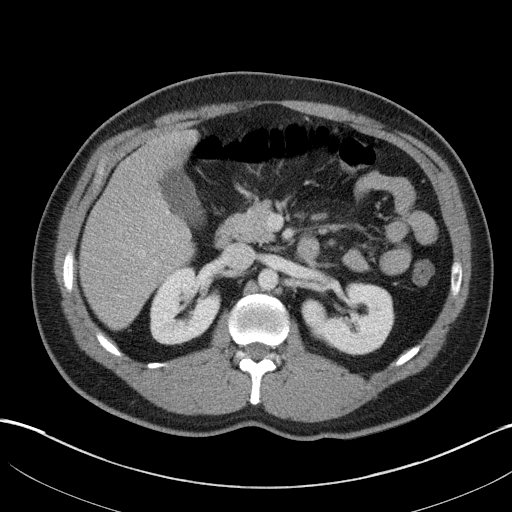
[im 72/96  soft-tissue]
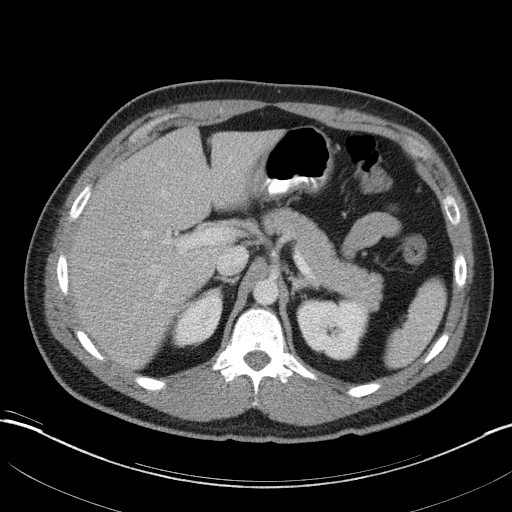
[im 76/96  soft-tissue]
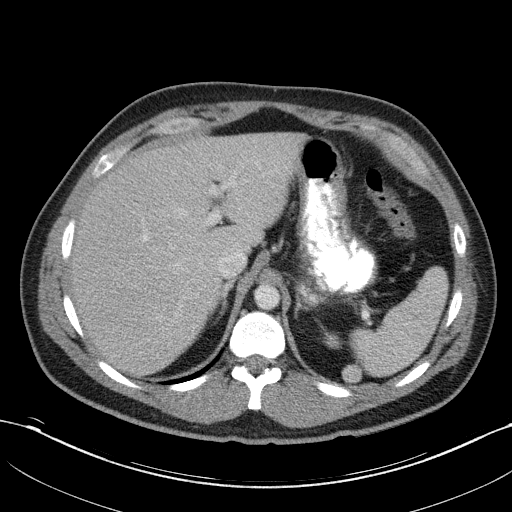
[im 84/96  soft-tissue]
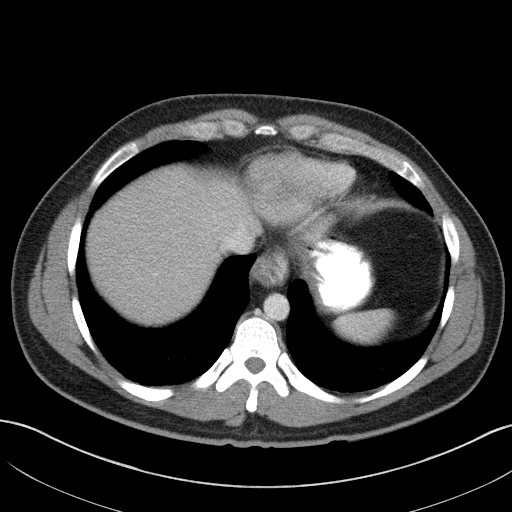
[im 92/96  soft-tissue]
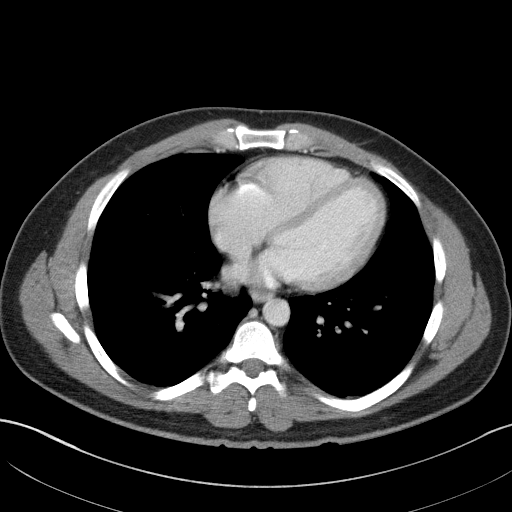

[Series 5: abd/pelvis 3.0 coronal · coronal · 0.89mm/px · 3 of 95 slices shown]
[im 32/95  soft-tissue]
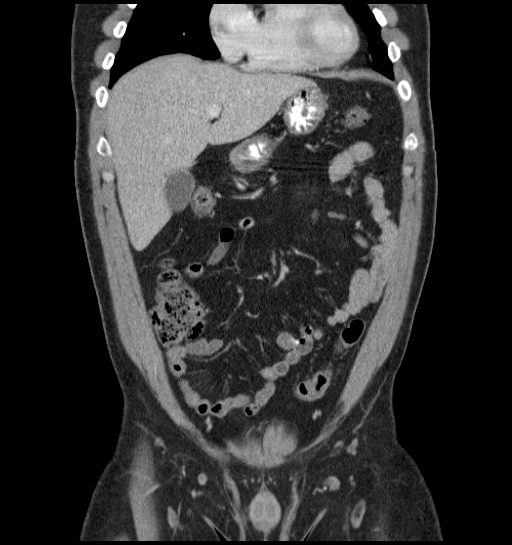
[im 42/95  soft-tissue]
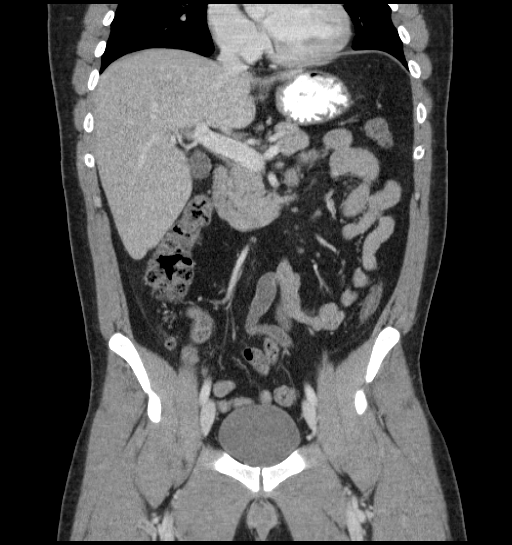
[im 53/95  soft-tissue]
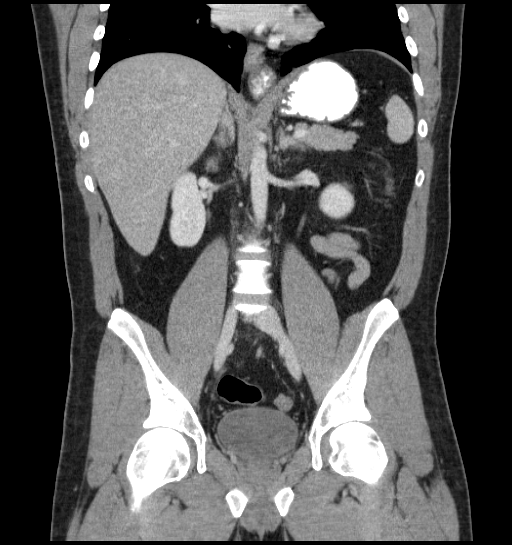

[17 of 46 positions shown; findings below may reference images not displayed]

FINDINGS: Lung bases show atelectasis.  Liver, gallbladder, spleen,
pancreas, adrenal glands and common bile duct appear normal.
Stomach and proximal small bowel appear normal.  Small hiatal
hernia.

Periappendiceal inflammatory changes are present near the tip of
the appendix (image number 51 series 2).  There is no free fluid in
the anatomic pelvis.  The colon is within normal limits.  No bowel
obstruction.  Vasculature appears normal.  No aggressive osseous
lesions.
IMPRESSION: Uncomplicated early acute appendicitis.

## 2014-10-03 ENCOUNTER — Telehealth: Payer: Self-pay | Admitting: Family Medicine

## 2014-10-03 NOTE — Telephone Encounter (Signed)
New patient requesting physical. No known medical conditions and patient isn't taking any medications on a regular basis. Appt scheduled with Dr Darlyn ReadStacks. Patient aware to arrive 15 minutes early.

## 2014-11-01 ENCOUNTER — Ambulatory Visit (INDEPENDENT_AMBULATORY_CARE_PROVIDER_SITE_OTHER): Payer: BLUE CROSS/BLUE SHIELD | Admitting: Family Medicine

## 2014-11-01 ENCOUNTER — Encounter: Payer: Self-pay | Admitting: Family Medicine

## 2014-11-01 ENCOUNTER — Encounter (INDEPENDENT_AMBULATORY_CARE_PROVIDER_SITE_OTHER): Payer: Self-pay

## 2014-11-01 VITALS — BP 126/79 | HR 100 | Temp 98.8°F | Ht 66.0 in | Wt 218.6 lb

## 2014-11-01 DIAGNOSIS — Z23 Encounter for immunization: Secondary | ICD-10-CM | POA: Diagnosis not present

## 2014-11-01 DIAGNOSIS — Z0000 Encounter for general adult medical examination without abnormal findings: Secondary | ICD-10-CM

## 2014-11-01 LAB — POCT CBC
GRANULOCYTE PERCENT: 61.7 % (ref 37–80)
HCT, POC: 46.9 % (ref 43.5–53.7)
Hemoglobin: 15.1 g/dL (ref 14.1–18.1)
LYMPH, POC: 2.2 (ref 0.6–3.4)
MCH: 30.2 pg (ref 27–31.2)
MCHC: 32.1 g/dL (ref 31.8–35.4)
MCV: 94.2 fL (ref 80–97)
MPV: 9.2 fL (ref 0–99.8)
PLATELET COUNT, POC: 240 10*3/uL (ref 142–424)
POC Granulocyte: 4.3 (ref 2–6.9)
POC LYMPH %: 31.9 % (ref 10–50)
RBC: 4.98 M/uL (ref 4.69–6.13)
RDW, POC: 13.2 %
WBC: 7 10*3/uL (ref 4.6–10.2)

## 2014-11-01 NOTE — Patient Instructions (Signed)

## 2014-11-01 NOTE — Progress Notes (Signed)
Subjective:  Patient ID: Timothy Torres, male    DOB: 10/01/1975  Age: 39 y.o. MRN: [id number]  CC: Annual Exam   HPI Timothy Torres presents for complete physical examination.  History Timothy Torres has a past medical history of No pertinent past medical history; Complication of anesthesia; and PONV (postoperative nausea and vomiting).   He has past surgical history that includes Appendectomy and laparoscopic appendectomy (04/15/2012).   His family history includes Heart disease in his maternal grandfather; Hypertension in his father and mother; Stroke in his paternal grandfather. There is no history of Cancer or Diabetes.He reports that he has never smoked. He has never used smokeless tobacco. He reports that he does not drink alcohol or use illicit drugs.  No current outpatient prescriptions on file prior to visit.   No current facility-administered medications on file prior to visit.    ROS Review of Systems  Constitutional: Negative for fever, chills, diaphoresis, activity change, appetite change, fatigue and unexpected weight change.  HENT: Negative for congestion, ear pain, hearing loss, postnasal drip, rhinorrhea, sore throat, tinnitus and trouble swallowing.   Eyes: Negative for photophobia, pain, discharge and redness.  Respiratory: Negative for apnea, cough, choking, chest tightness, shortness of breath, wheezing and stridor.   Cardiovascular: Negative for chest pain, palpitations and leg swelling.  Gastrointestinal: Negative for nausea, vomiting, abdominal pain, diarrhea, constipation, blood in stool and abdominal distention.  Endocrine: Negative for cold intolerance, heat intolerance, polydipsia, polyphagia and polyuria.  Genitourinary: Negative for dysuria, urgency, frequency, hematuria, flank pain, enuresis, difficulty urinating and genital sores.  Musculoskeletal: Negative for joint swelling and arthralgias.  Skin: Negative for color change, rash and wound.    Allergic/Immunologic: Negative for immunocompromised state.  Neurological: Negative for dizziness, tremors, seizures, syncope, facial asymmetry, speech difficulty, weakness, light-headedness, numbness and headaches.  Hematological: Does not bruise/bleed easily.  Psychiatric/Behavioral: Negative for suicidal ideas, hallucinations, behavioral problems, confusion, sleep disturbance, dysphoric mood, decreased concentration and agitation. The patient is not nervous/anxious and is not hyperactive.     Objective:  BP 126/79 mmHg  Pulse 100  Temp(Src) 98.8 F (37.1 C) (Oral)  Ht _0  (1.676 m)  Wt 218 lb 9.6 oz (99.156 kg)  BMI 35.30 kg/m2  BP Readings from Last 3 Encounters:  11/01/14 126/79  04/20/12 136/90  04/16/12 130/76    Wt Readings from Last 3 Encounters:  11/01/14 218 lb 9.6 oz (99.156 kg)  04/20/12 184 lb 3.2 oz (83.553 kg)  04/15/12 201 lb 1 oz (91.2 kg)     Physical Exam  Constitutional: He is oriented to person, place, and time. He appears well-developed and well-nourished.  HENT:  Head: Normocephalic and atraumatic.  Mouth/Throat: Oropharynx is clear and moist.  Eyes: EOM are normal. Pupils are equal, round, and reactive to light.  Neck: Normal range of motion. No tracheal deviation present. No thyromegaly present.  Cardiovascular: Normal rate, regular rhythm and normal heart sounds.  Exam reveals no gallop and no friction rub.   No murmur heard. Pulmonary/Chest: Breath sounds normal. He has no wheezes. He has no rales.  Abdominal: Soft. He exhibits no mass. There is no tenderness.  Musculoskeletal: Normal range of motion. He exhibits no edema.  Neurological: He is alert and oriented to person, place, and time.  Skin: Skin is warm and dry.  Psychiatric: He has a normal mood and affect.    No results found for: HGBA1C  Lab Results  Component Value Date   WBC 7.0 11/01/2014   HGB 15.1 11/01/2014  HCT 46.9 11/01/2014   PLT 315 04/20/2012   GLUCOSE 86  04/20/2012   ALT 24 04/14/2012   AST 21 04/14/2012   NA 140 04/20/2012   K 4.7 04/20/2012   CL 104 04/20/2012   CREATININE 1.03 04/20/2012   BUN 12 04/20/2012   CO2 28 04/20/2012    US Abdomen Complete  04/14/2012   *RADIOLOGY REPORT*  Clinical Data:  39 year old male with sudden onset of epigastric pain.  COMPLETE ABDOMINAL ULTRASOUND  Comparison:  None.  Findings:  Gallbladder:  No gallstones, gallbladder wall thickening, or pericholecystic fluid. No sonographic Murphy's sign elicited.  Common bile duct:  Normal measuring 2 mm diameter.  Liver:  No focal lesion identified.  Within normal limits in parenchymal echogenicity.  IVC:  Incompletely visualized due to overlying bowel gas, visualized portions within normal limits.  Pancreas:  Incompletely visualized due to overlying bowel gas, visualized portions within normal limits.  Spleen:  Normal measuring 8.6 cm in length.  Right Kidney:  Normal measuring 9.8 cm in length.  Left Kidney:  Normal measuring 9.8 cm in length.  Abdominal aorta:  Incompletely visualized due to overlying bowel gas, visualized portions within normal limits.  IMPRESSION: No cholelithiasis.  Negative abdominal ultrasound.   Original Report Authenticated By: Timothy Torres, M.D.   Ct Abdomen Pelvis W Contrast  04/15/2012   *RADIOLOGY REPORT*  Clinical Data: Right lower quadrant pain.  Nausea, vomiting, diarrhea.  CT ABDOMEN AND PELVIS WITH CONTRAST  Technique:  Multidetector CT imaging of the abdomen and pelvis was performed following the standard protocol during bolus administration of intravenous contrast.  Contrast: 77m OMNIPAQUE IOHEXOL 300 MG/ML  SOLN, 1047mOMNIPAQUE IOHEXOL 300 MG/ML  SOLN  Comparison: None.  Findings: Lung bases show atelectasis.  Liver, gallbladder, spleen, pancreas, adrenal glands and common bile duct appear normal. Stomach and proximal small bowel appear normal.  Small hiatal hernia.  Periappendiceal inflammatory changes are present near the tip of  the appendix (image number 51 series 2).  There is no free fluid in the anatomic pelvis.  The colon is within normal limits.  No bowel obstruction.  Vasculature appears normal.  No aggressive osseous lesions.  IMPRESSION: Uncomplicated early acute appendicitis.   Original Report Authenticated By: Timothy LigasM.D.   Dg Abd Acute W/chest  04/14/2012   *RADIOLOGY REPORT*  Clinical Data: [age]ear old male epigastric pain and vomiting.  ACUTE ABDOMEN SERIES (ABDOMEN 2 VIEW & CHEST 1 VIEW)  Comparison: None.  Findings: Somewhat shallow lung volumes.  The lungs are clear. Cardiac size and mediastinal contours are within normal limits. Visualized tracheal air column is within normal limits.  No pneumothorax or pneumoperitoneum.  Nonobstructed bowel gas pattern.  Abdominal and pelvic visceral contours are within normal limits. No osseous abnormality identified.  IMPRESSION: Nonobstructed bowel gas pattern, no free air. Negative chest.   Original Report Authenticated By: Timothy AnM.D.    Assessment & Plan:   RoKiplingas seen today for annual exam.  Diagnoses and all orders for this visit:  Routine general medical examination at a health care facility Orders: -     POCT CBC -     CMP14+EGFR; Standing -     Lipid panel; Standing -     Vit D  25 hydroxy (rtn osteoporosis monitoring); Standing -     Thyroid Panel With TSH -     Lipid panel -     Vit D  25 hydroxy (rtn osteoporosis monitoring) -     CMP14+EGFR  Other orders -     Tdap vaccine greater than or equal to 7yo IM   I have discontinued Timothy Torres ibuprofen and acetaminophen.  No orders of the defined types were placed in this encounter.   Patient was counseled on appropriate diet. Low carbohydrate, low sodium, high protein approach.   Regular exercise benefit with mix of cardio and resistance training was reviewed.  Reminded to wear seat belt when driving or a passenger.  Pt given handouts for the following topics:  Calorie counting for weight loss .Follow-up: Return in about 1 year (around 11/01/2015), or if symptoms worsen or fail to improve, for CPE.  Claretta Fraise, M.D.

## 2014-11-02 LAB — CMP14+EGFR
ALK PHOS: 54 IU/L (ref 39–117)
ALT: 35 IU/L (ref 0–44)
AST: 22 IU/L (ref 0–40)
Albumin/Globulin Ratio: 1.7 (ref 1.1–2.5)
Albumin: 4.6 g/dL (ref 3.5–5.5)
BILIRUBIN TOTAL: 0.2 mg/dL (ref 0.0–1.2)
BUN/Creatinine Ratio: 11 (ref 8–19)
BUN: 11 mg/dL (ref 6–20)
CALCIUM: 9.5 mg/dL (ref 8.7–10.2)
CO2: 24 mmol/L (ref 18–29)
CREATININE: 1 mg/dL (ref 0.76–1.27)
Chloride: 102 mmol/L (ref 97–108)
GFR calc Af Amer: 111 mL/min/{1.73_m2} (ref >59)
GFR, EST NON AFRICAN AMERICAN: 96 mL/min/{1.73_m2} (ref >59)
GLUCOSE: 88 mg/dL (ref 65–99)
Globulin, Total: 2.7 g/dL (ref 1.5–4.5)
Potassium: 4.4 mmol/L (ref 3.5–5.2)
Sodium: 139 mmol/L (ref 134–144)
TOTAL PROTEIN: 7.3 g/dL (ref 6.0–8.5)

## 2014-11-02 LAB — LIPID PANEL
Chol/HDL Ratio: 4.9 ratio units (ref 0.0–5.0)
Cholesterol, Total: 214 mg/dL — ABNORMAL HIGH (ref 100–199)
HDL: 44 mg/dL (ref >39)
LDL Calculated: 99 mg/dL (ref 0–99)
TRIGLYCERIDES: 357 mg/dL — AB (ref 0–149)
VLDL Cholesterol Cal: 71 mg/dL — ABNORMAL HIGH (ref 5–40)

## 2014-11-02 LAB — THYROID PANEL WITH TSH
FREE THYROXINE INDEX: 1.8 (ref 1.2–4.9)
T3 UPTAKE RATIO: 26 % (ref 24–39)
T4 TOTAL: 7 ug/dL (ref 4.5–12.0)
TSH: 3.17 u[IU]/mL (ref 0.450–4.500)

## 2014-11-02 LAB — VITAMIN D 25 HYDROXY (VIT D DEFICIENCY, FRACTURES): Vit D, 25-Hydroxy: 15.4 ng/mL — ABNORMAL LOW (ref 30.0–100.0)

## 2014-11-04 ENCOUNTER — Other Ambulatory Visit: Payer: Self-pay | Admitting: Family Medicine

## 2014-11-04 MED ORDER — VITAMIN D (ERGOCALCIFEROL) 1.25 MG (50000 UNIT) PO CAPS: 50000.0000 [IU] | ORAL_CAPSULE | ORAL | Status: DC

## 2015-03-31 ENCOUNTER — Telehealth: Payer: Self-pay | Admitting: Family Medicine

## 2015-03-31 NOTE — Telephone Encounter (Signed)
Please advise 

## 2015-04-02 NOTE — Telephone Encounter (Signed)
Please set up an appointment for the patient to discuss his symptoms with me.

## 2015-04-06 NOTE — Telephone Encounter (Signed)
Appointment made with pt

## 2015-04-11 ENCOUNTER — Encounter: Payer: Self-pay | Admitting: Family Medicine

## 2015-04-11 ENCOUNTER — Ambulatory Visit (INDEPENDENT_AMBULATORY_CARE_PROVIDER_SITE_OTHER): Payer: BLUE CROSS/BLUE SHIELD | Admitting: Family Medicine

## 2015-04-11 VITALS — BP 141/82 | HR 78 | Temp 98.1°F | Ht 66.0 in | Wt 215.8 lb

## 2015-04-11 DIAGNOSIS — F418 Other specified anxiety disorders: Secondary | ICD-10-CM

## 2015-04-11 MED ORDER — DULOXETINE HCL 30 MG PO CPEP: 30.0000 mg | ORAL_CAPSULE | Freq: Every day | ORAL | Status: DC

## 2015-04-11 NOTE — Progress Notes (Signed)
Subjective:  Patient ID: Timothy Torres, male    DOB: 05-13-1976  Age: 39 y.o. MRN: [id number]  CC: Depression   HPI Vashawn Ekstein presents for overwhelmed at work. Becoming more irritable at home. Has used Lexapro in the past for similar symptoms. His wife has commented that he is more difficult in the home. The patient is concerned about his interaction with his kids and wife. He has begun to feel dysphoric. He does not enjoy usual activities. He does have some pleasure in the relationship with his children and does activities. But overall they feel less pleasant. He recognizes symptoms of depression that are similar to what he's had in the past. He feels overwhelmed by anxiety about being unable to handle his workload. He gets chest pain. He is having early awakening. Difficulty getting to sleep  History Leelan has a past medical history of No pertinent past medical history; Complication of anesthesia; and PONV (postoperative nausea and vomiting).   He has past surgical history that includes Appendectomy and laparoscopic appendectomy (04/15/2012).   His family history includes Heart disease in his maternal grandfather; Hypertension in his father and mother; Stroke in his paternal grandfather. There is no history of Cancer or Diabetes.He reports that he has never smoked. He has never used smokeless tobacco. He reports that he does not drink alcohol or use illicit drugs.  Outpatient Prescriptions Prior to Visit  Medication Sig Dispense Refill  . Vitamin D, Ergocalciferol, (DRISDOL) 50000 UNITS CAPS capsule Take 1 capsule (50,000 Units total) by mouth 2 (two) times a week. (Patient not taking: Reported on 04/11/2015) 16 capsule 0   No facility-administered medications prior to visit.    ROS Review of Systems  Constitutional: Negative for fever, chills and diaphoresis.  HENT: Negative for congestion, rhinorrhea and sore throat.   Respiratory: Negative for cough, shortness of breath and  wheezing.   Cardiovascular: Negative for chest pain.  Gastrointestinal: Negative for nausea, vomiting, abdominal pain, diarrhea, constipation and abdominal distention.  Genitourinary: Negative for dysuria and frequency.  Musculoskeletal: Negative for joint swelling and arthralgias.  Skin: Negative for rash.  Neurological: Negative for headaches.    Objective:  BP 141/82 mmHg  Pulse 78  Temp(Src) 98.1 F (36.7 C) (Oral)  Ht  (1.676 m)  Wt 215 lb 12.8 oz (97.886 kg)  BMI 34.85 kg/m2  BP Readings from Last 3 Encounters:  04/11/15 141/82  11/01/14 126/79  04/20/12 136/90    Wt Readings from Last 3 Encounters:  04/11/15 215 lb 12.8 oz (97.886 kg)  11/01/14 218 lb 9.6 oz (99.156 kg)  04/20/12 184 lb 3.2 oz (83.553 kg)     Physical Exam  Constitutional: He is oriented to person, place, and time. He appears well-developed and well-nourished. No distress.  HENT:  Head: Normocephalic and atraumatic.  Right Ear: External ear normal.  Left Ear: External ear normal.  Nose: Nose normal.  Mouth/Throat: Oropharynx is clear and moist.  Eyes: Conjunctivae and EOM are normal. Pupils are equal, round, and reactive to light.  Neck: Normal range of motion. Neck supple. No thyromegaly present.  Cardiovascular: Normal rate, regular rhythm and normal heart sounds.   No murmur heard. Pulmonary/Chest: Effort normal and breath sounds normal. No respiratory distress. He has no wheezes. He has no rales.  Abdominal: Soft. Bowel sounds are normal. He exhibits no distension. There is no tenderness.  Lymphadenopathy:    He has no cervical adenopathy.  Neurological: He is alert and oriented to person, place, and time.  He has normal reflexes.  Skin: Skin is warm and dry.  Psychiatric: He has a normal mood and affect. His behavior is normal. Judgment and thought content normal.    No results found for: HGBA1C  Lab Results  Component Value Date   WBC 7.0 11/01/2014   HGB 15.1 11/01/2014    HCT 46.9 11/01/2014   PLT 315 04/20/2012   GLUCOSE 88 11/01/2014   CHOL 214* 11/01/2014   TRIG 357* 11/01/2014   HDL 44 11/01/2014   LDLCALC 99 11/01/2014   ALT 35 11/01/2014   AST 22 11/01/2014   NA 139 11/01/2014   K 4.4 11/01/2014   CL 102 11/01/2014   CREATININE 1.00 11/01/2014   BUN 11 11/01/2014   CO2 24 11/01/2014   TSH 3.170 11/01/2014    US Abdomen Complete  04/14/2012   *RADIOLOGY REPORT*  Clinical Data:  39 year old male with sudden onset of epigastric pain.  COMPLETE ABDOMINAL ULTRASOUND  Comparison:  None.  Findings:  Gallbladder:  No gallstones, gallbladder wall thickening, or pericholecystic fluid. No sonographic Murphy's sign elicited.  Common bile duct:  Normal measuring 2 mm diameter.  Liver:  No focal lesion identified.  Within normal limits in parenchymal echogenicity.  IVC:  Incompletely visualized due to overlying bowel gas, visualized portions within normal limits.  Pancreas:  Incompletely visualized due to overlying bowel gas, visualized portions within normal limits.  Spleen:  Normal measuring 8.6 cm in length.  Right Kidney:  Normal measuring 9.8 cm in length.  Left Kidney:  Normal measuring 9.8 cm in length.  Abdominal aorta:  Incompletely visualized due to overlying bowel gas, visualized portions within normal limits.  IMPRESSION: No cholelithiasis.  Negative abdominal ultrasound.   Original Report Authenticated By: Harley Hallmark, M.D.   Ct Abdomen Pelvis W Contrast  04/15/2012   *RADIOLOGY REPORT*  Clinical Data: Right lower quadrant pain.  Nausea, vomiting, diarrhea.  CT ABDOMEN AND PELVIS WITH CONTRAST  Technique:  Multidetector CT imaging of the abdomen and pelvis was performed following the standard protocol during bolus administration of intravenous contrast.  Contrast: 20mL OMNIPAQUE IOHEXOL 300 MG/ML  SOLN, OMNIPAQUE IOHEXOL 300 MG/ML  SOLN  Comparison: None.  Findings: Lung bases show atelectasis.  Liver, gallbladder, spleen, pancreas, adrenal glands  and common bile duct appear normal. Stomach and proximal small bowel appear normal.  Small hiatal hernia.  Periappendiceal inflammatory changes are present near the tip of the appendix (image number 51 series 2).  There is no free fluid in the anatomic pelvis.  The colon is within normal limits.  No bowel obstruction.  Vasculature appears normal.  No aggressive osseous lesions.  IMPRESSION: Uncomplicated early acute appendicitis.   Original Report Authenticated By: Andreas Newport, M.D.   Dg Abd Acute W/chest  04/14/2012   *RADIOLOGY REPORT*  Clinical Data: 39 year old male epigastric pain and vomiting.  ACUTE ABDOMEN SERIES (ABDOMEN 2 VIEW & CHEST 1 VIEW)  Comparison: None.  Findings: Somewhat shallow lung volumes.  The lungs are clear. Cardiac size and mediastinal contours are within normal limits. Visualized tracheal air column is within normal limits.  No pneumothorax or pneumoperitoneum.  Nonobstructed bowel gas pattern.  Abdominal and pelvic visceral contours are within normal limits. No osseous abnormality identified.  IMPRESSION: Nonobstructed bowel gas pattern, no free air. Negative chest.   Original Report Authenticated By: Harley Hallmark, M.D.    Assessment & Plan:   Markelle was seen today for depression.  Diagnoses and all orders for this visit:  Depression  with anxiety  Other orders -     DULoxetine (CYMBALTA) 30 MG capsule; Take 1 capsule (30 mg total) by mouth daily. For one week then two daily. Take with a full stomach at suppertime   I have discontinued Mr. Reiber Vitamin D (Ergocalciferol). I am also having him start on DULoxetine.  Meds ordered this encounter  Medications  . DULoxetine (CYMBALTA) 30 MG capsule    Sig: Take 1 capsule (30 mg total) by mouth daily. For one week then two daily. Take with a full stomach at suppertime    Dispense:  60 capsule    Refill:  0     Follow-up: Return in about 1 month (around 05/11/2015).  Mechele Claude, M.D.

## 2015-05-15 ENCOUNTER — Encounter: Payer: Self-pay | Admitting: Family Medicine

## 2015-05-15 ENCOUNTER — Ambulatory Visit (INDEPENDENT_AMBULATORY_CARE_PROVIDER_SITE_OTHER): Payer: BLUE CROSS/BLUE SHIELD | Admitting: Family Medicine

## 2015-05-15 VITALS — BP 126/75 | HR 85 | Temp 98.3°F | Ht 66.0 in | Wt 211.0 lb

## 2015-05-15 DIAGNOSIS — F418 Other specified anxiety disorders: Secondary | ICD-10-CM | POA: Diagnosis not present

## 2015-05-15 MED ORDER — DULOXETINE HCL 60 MG PO CPEP: 60.0000 mg | ORAL_CAPSULE | Freq: Every day | ORAL | Status: DC

## 2015-05-15 NOTE — Progress Notes (Signed)
Subjective:  Patient ID: Timothy Torres, male    DOB: March 26, 1976  Age: 39 y.o. MRN: [id number]  CC: Depression   HPI Timothy LanceRobert Cinnamon presents for follow-up of his anxiety and depression. He feels like the depression has lifted and his wife says his attitude is better and he is easier to get along with. Patient has no sensation of anxiety. He relates that he is having some trouble with nausea. It's gotten a lot better but his appetite still isn't quite what it was. Onset of this was after the first dose of the Cymbalta. It continued as he titrated the dose up the following week. But has gradually faded. He says he yawns frequently through the day. He feels somewhat tired. It is not interfering with his lifestyle significantly. Currently he still has problems with urinary hesitation and some burning. But he is able to urinate as long as he doesn't rush. The headaches are about the same. Perhaps a bit less frequent but just as intense. They last for about an hour and a half. They occur at the left parietal region and are a 6/10. He does not take anything for them.   History Molly MaduroRobert has a past medical history of No pertinent past medical history; Complication of anesthesia; and PONV (postoperative nausea and vomiting).   He has past surgical history that includes Appendectomy and laparoscopic appendectomy (04/15/2012).   His family history includes Heart disease in his maternal grandfather; Hypertension in his father and mother; Stroke in his paternal grandfather. There is no history of Cancer or Diabetes.He reports that he has never smoked. He has never used smokeless tobacco. He reports that he does not drink alcohol or use illicit drugs.  Outpatient Prescriptions Prior to Visit  Medication Sig Dispense Refill  . DULoxetine (CYMBALTA) 30 MG capsule Take 1 capsule (30 mg total) by mouth daily. For one week then two daily. Take with a full stomach at suppertime 60 capsule 0   No  facility-administered medications prior to visit.    ROS Review of Systems  Constitutional: Positive for fatigue. Negative for fever, chills and diaphoresis.  HENT: Negative for congestion, rhinorrhea and sore throat.   Respiratory: Negative for cough, shortness of breath and wheezing.   Cardiovascular: Negative for chest pain.  Gastrointestinal: Negative for nausea, vomiting, abdominal pain, diarrhea, constipation and abdominal distention.  Genitourinary: Negative for dysuria and frequency.  Musculoskeletal: Negative for joint swelling and arthralgias.  Skin: Negative for rash.  Neurological: Negative for headaches.  Psychiatric/Behavioral: Negative for confusion, sleep disturbance, dysphoric mood and agitation. The patient is not nervous/anxious.     Objective:  BP 126/75 mmHg  Pulse 85  Temp(Src) 98.3 F (36.8 C) (Oral)  Ht 5\' 6"  (1.676 m)  Wt 211 lb (95.709 kg)  BMI 34.07 kg/m2  BP Readings from Last 3 Encounters:  05/15/15 126/75  04/11/15 141/82  11/01/14 126/79    Wt Readings from Last 3 Encounters:  05/15/15 211 lb (95.709 kg)  04/11/15 215 lb 12.8 oz (97.886 kg)  11/01/14 218 lb 9.6 oz (99.156 kg)     Physical Exam  Constitutional: He is oriented to person, place, and time. He appears well-developed and well-nourished. No distress.  HENT:  Head: Normocephalic and atraumatic.  Right Ear: External ear normal.  Left Ear: External ear normal.  Nose: Nose normal.  Mouth/Throat: Oropharynx is clear and moist.  Eyes: Conjunctivae and EOM are normal. Pupils are equal, round, and reactive to light.  Neck: Normal range of motion. Neck  supple. No thyromegaly present.  Cardiovascular: Normal rate, regular rhythm and normal heart sounds.   No murmur heard. Pulmonary/Chest: Effort normal and breath sounds normal. No respiratory distress. He has no wheezes. He has no rales.  Abdominal: Soft. Bowel sounds are normal. He exhibits no distension. There is no tenderness.    Lymphadenopathy:    He has no cervical adenopathy.  Neurological: He is alert and oriented to person, place, and time. He has normal reflexes.  Skin: Skin is warm and dry.  Psychiatric: He has a normal mood and affect. His behavior is normal. Judgment and thought content normal.    No results found for: HGBA1C  Lab Results  Component Value Date   WBC 7.0 11/01/2014   HGB 15.1 11/01/2014   HCT 46.9 11/01/2014   PLT 315 04/20/2012   GLUCOSE 88 11/01/2014   CHOL 214* 11/01/2014   TRIG 357* 11/01/2014   HDL 44 11/01/2014   LDLCALC 99 11/01/2014   ALT 35 11/01/2014   AST 22 11/01/2014   NA 139 11/01/2014   K 4.4 11/01/2014   CL 102 11/01/2014   CREATININE 1.00 11/01/2014   BUN 11 11/01/2014   CO2 24 11/01/2014   TSH 3.170 11/01/2014    US Abdomen Complete  04/14/2012  *RADIOLOGY REPORT* Clinical Data:  39 year old male with sudden onset of epigastric pain. COMPLETE ABDOMINAL ULTRASOUND Comparison:  None. Findings: Gallbladder:  No gallstones, gallbladder wall thickening, or pericholecystic fluid. No sonographic Murphy's sign elicited. Common bile duct:  Normal measuring 2 mm diameter. Liver:  No focal lesion identified.  Within normal limits in parenchymal echogenicity. IVC:  Incompletely visualized due to overlying bowel gas, visualized portions within normal limits. Pancreas:  Incompletely visualized due to overlying bowel gas, visualized portions within normal limits. Spleen:  Normal measuring 8.6 cm in length. Right Kidney:  Normal measuring 9.8 cm in length. Left Kidney:  Normal measuring 9.8 cm in length. Abdominal aorta:  Incompletely visualized due to overlying bowel gas, visualized portions within normal limits. IMPRESSION: No cholelithiasis.  Negative abdominal ultrasound. Original Report Authenticated By: Harley Hallmark, M.D.   Ct Abdomen Pelvis W Contrast  04/15/2012  *RADIOLOGY REPORT* Clinical Data: Right lower quadrant pain.  Nausea, vomiting, diarrhea. CT ABDOMEN AND  PELVIS WITH CONTRAST Technique:  Multidetector CT imaging of the abdomen and pelvis was performed following the standard protocol during bolus administration of intravenous contrast. Contrast: 20mL OMNIPAQUE IOHEXOL 300 MG/ML  SOLN, OMNIPAQUE IOHEXOL 300 MG/ML  SOLN Comparison: None. Findings: Lung bases show atelectasis.  Liver, gallbladder, spleen, pancreas, adrenal glands and common bile duct appear normal. Stomach and proximal small bowel appear normal.  Small hiatal hernia. Periappendiceal inflammatory changes are present near the tip of the appendix (image number 51 series 2).  There is no free fluid in the anatomic pelvis.  The colon is within normal limits.  No bowel obstruction.  Vasculature appears normal.  No aggressive osseous lesions. IMPRESSION: Uncomplicated early acute appendicitis. Original Report Authenticated By: Andreas Newport, M.D.   Dg Abd Acute W/chest  04/14/2012  *RADIOLOGY REPORT* Clinical Data: 39 year old male epigastric pain and vomiting. ACUTE ABDOMEN SERIES (ABDOMEN 2 VIEW & CHEST 1 VIEW) Comparison: None. Findings: Somewhat shallow lung volumes.  The lungs are clear. Cardiac size and mediastinal contours are within normal limits. Visualized tracheal air column is within normal limits.  No pneumothorax or pneumoperitoneum. Nonobstructed bowel gas pattern.  Abdominal and pelvic visceral contours are within normal limits. No osseous abnormality identified. IMPRESSION: Nonobstructed bowel gas  pattern, no free air. Negative chest. Original Report Authenticated By: Harley Hallmark, M.D.    Assessment & Plan:   Tramayne was seen today for depression.  Diagnoses and all orders for this visit:  Depression with anxiety  Other orders -     DULoxetine (CYMBALTA) 60 MG capsule; Take 1 capsule (60 mg total) by mouth daily.   I have changed Mr. Avina DULoxetine.  Meds ordered this encounter  Medications  . DULoxetine (CYMBALTA) 60 MG capsule    Sig: Take 1 capsule (60  mg total) by mouth daily.    Dispense:  30 capsule    Refill:  2     Follow-up: Return in about 1 month (around 06/15/2015) for Depression.  Mechele Claude, M.D.

## 2015-06-16 ENCOUNTER — Encounter: Payer: Self-pay | Admitting: Family Medicine

## 2015-06-16 ENCOUNTER — Ambulatory Visit (INDEPENDENT_AMBULATORY_CARE_PROVIDER_SITE_OTHER): Payer: BLUE CROSS/BLUE SHIELD | Admitting: Family Medicine

## 2015-06-16 VITALS — BP 125/84 | HR 81 | Temp 97.6°F | Ht 66.0 in | Wt 213.6 lb

## 2015-06-16 DIAGNOSIS — Z630 Problems in relationship with spouse or partner: Secondary | ICD-10-CM | POA: Diagnosis not present

## 2015-06-16 DIAGNOSIS — F418 Other specified anxiety disorders: Secondary | ICD-10-CM

## 2015-06-16 MED ORDER — DESVENLAFAXINE ER 100 MG PO TB24: 100.0000 mg | ORAL_TABLET | Freq: Every day | ORAL | Status: DC

## 2015-06-19 NOTE — Progress Notes (Signed)
Subjective:  Patient ID: Timothy Torres, male    DOB: [birth year]  Age: 39 y.o. MRN: [id number]  CC: Depression   HPI Kaiel Weide presents for follow-up to depression. He is having significant nausea but manageable with the Cymbalta. However he has "gone off on" his wife on a couple of occasions. He also had words with his boss. Apparently his supervisor asked him to find some financial figures and he refused. His boss became angry with him. He returned the finger in kind, yelling at his boss over the situation. This occurred approximately 1-2 weeks ago.  History Sadat has a past medical history of No pertinent past medical history; Complication of anesthesia; and PONV (postoperative nausea and vomiting).   He has past surgical history that includes Appendectomy and laparoscopic appendectomy (04/15/2012).   His family history includes Heart disease in his maternal grandfather; Hypertension in his father and mother; Stroke in his paternal grandfather. There is no history of Cancer or Diabetes.He reports that he has never smoked. He has never used smokeless tobacco. He reports that he does not drink alcohol or use illicit drugs.  Outpatient Prescriptions Prior to Visit  Medication Sig Dispense Refill  . DULoxetine (CYMBALTA) 60 MG capsule Take 1 capsule (60 mg total) by mouth daily. 30 capsule 2   No facility-administered medications prior to visit.    ROS Review of Systems  Constitutional: Negative for fever, chills and diaphoresis.  HENT: Negative for rhinorrhea and sore throat.   Respiratory: Negative for cough and shortness of breath.   Cardiovascular: Negative for chest pain.  Gastrointestinal: Positive for nausea (as a side effect of Cymbalta). Negative for abdominal pain.  Musculoskeletal: Negative for myalgias and arthralgias.  Skin: Negative for rash.  Neurological: Negative for weakness and headaches.  Psychiatric/Behavioral: Positive for behavioral problems and  dysphoric mood. Negative for sleep disturbance.    Objective:  BP 125/84 mmHg  Pulse 81  Temp(Src) 97.6 F (36.4 C) (Oral)  Ht  (1.676 m)  Wt 213 lb 9.6 oz (96.888 kg)  BMI 34.49 kg/m2  BP Readings from Last 3 Encounters:  06/16/15 125/84  05/15/15 126/75  04/11/15 141/82    Wt Readings from Last 3 Encounters:  06/16/15 213 lb 9.6 oz (96.888 kg)  05/15/15 211 lb (95.709 kg)  04/11/15 215 lb 12.8 oz (97.886 kg)     Physical Exam  Constitutional: He appears well-developed and well-nourished.  HENT:  Head: Normocephalic and atraumatic.  Right Ear: Tympanic membrane and external ear normal. No decreased hearing is noted.  Left Ear: Tympanic membrane and external ear normal. No decreased hearing is noted.  Mouth/Throat: No oropharyngeal exudate or posterior oropharyngeal erythema.  Eyes: Pupils are equal, round, and reactive to light.  Neck: Normal range of motion. Neck supple.  Cardiovascular: Normal rate and regular rhythm.   No murmur heard. Pulmonary/Chest: Breath sounds normal. No respiratory distress.  Abdominal: Soft. Bowel sounds are normal. He exhibits no mass. There is no tenderness.  Vitals reviewed.   No results found for: HGBA1C  Lab Results  Component Value Date   WBC 7.0 11/01/2014   HGB 15.1 11/01/2014   HCT 46.9 11/01/2014   PLT 315 04/20/2012   GLUCOSE 88 11/01/2014   CHOL 214* 11/01/2014   TRIG 357* 11/01/2014   HDL 44 11/01/2014   LDLCALC 99 11/01/2014   ALT 35 11/01/2014   AST 22 11/01/2014   NA 139 11/01/2014   K 4.4 11/01/2014   CL 102 11/01/2014   CREATININE  1.00 11/01/2014   BUN 11 11/01/2014   CO2 24 11/01/2014   TSH 3.170 11/01/2014    [place] Abdomen Complete  04/14/2012  *RADIOLOGY REPORT* Clinical Data:  39 year old male with sudden onset of epigastric pain. COMPLETE ABDOMINAL ULTRASOUND Comparison:  None. Findings: Gallbladder:  No gallstones, gallbladder wall thickening, or pericholecystic fluid. No sonographic Murphy's sign  elicited. Common bile duct:  Normal measuring 2 mm diameter. Liver:  No focal lesion identified.  Within normal limits in parenchymal echogenicity. IVC:  Incompletely visualized due to overlying bowel gas, visualized portions within normal limits. Pancreas:  Incompletely visualized due to overlying bowel gas, visualized portions within normal limits. Spleen:  Normal measuring 8.6 cm in length. Right Kidney:  Normal measuring 9.8 cm in length. Left Kidney:  Normal measuring 9.8 cm in length. Abdominal aorta:  Incompletely visualized due to overlying bowel gas, visualized portions within normal limits. IMPRESSION: No cholelithiasis.  Negative abdominal ultrasound. Original Report Authenticated By: Harley HallmarkH.LEE HALL III, M.D.   Ct Abdomen Pelvis W Contrast  04/15/2012  *RADIOLOGY REPORT* Clinical Data: Right lower quadrant pain.  Nausea, vomiting, diarrhea. CT ABDOMEN AND PELVIS WITH CONTRAST Technique:  Multidetector CT imaging of the abdomen and pelvis was performed following the standard protocol during bolus administration of intravenous contrast. Contrast: 20mL OMNIPAQUE IOHEXOL 300 MG/ML  SOLN, 100mL OMNIPAQUE IOHEXOL 300 MG/ML  SOLN Comparison: None. Findings: Lung bases show atelectasis.  Liver, gallbladder, spleen, pancreas, adrenal glands and common bile duct appear normal. Stomach and proximal small bowel appear normal.  Small hiatal hernia. Periappendiceal inflammatory changes are present near the tip of the appendix (image number 51 series 2).  There is no free fluid in the anatomic pelvis.  The colon is within normal limits.  No bowel obstruction.  Vasculature appears normal.  No aggressive osseous lesions. IMPRESSION: Uncomplicated early acute appendicitis. Original Report Authenticated By: Andreas NewportGEOFFREY LAMKE, M.D.   Dg Abd Acute W/chest  04/14/2012  *RADIOLOGY REPORT* Clinical Data: 39 year old male epigastric pain and vomiting. ACUTE ABDOMEN SERIES (ABDOMEN 2 VIEW & CHEST 1 VIEW) Comparison: None. Findings:  Somewhat shallow lung volumes.  The lungs are clear. Cardiac size and mediastinal contours are within normal limits. Visualized tracheal air column is within normal limits.  No pneumothorax or pneumoperitoneum. Nonobstructed bowel gas pattern.  Abdominal and pelvic visceral contours are within normal limits. No osseous abnormality identified. IMPRESSION: Nonobstructed bowel gas pattern, no free air. Negative chest. Original Report Authenticated By: Harley HallmarkH.LEE HALL III, M.D.    Assessment & Plan:   Molly MaduroRobert was seen today for depression.  Diagnoses and all orders for this visit:  Marital conflict -     [facility]  Other orders -     Desvenlafaxine ER 100 MG TB24; Take 1 tablet (100 mg total) by mouth at bedtime.   I have discontinued Mr. Georgia DuffBenfield's DULoxetine. I am also having him start on Desvenlafaxine ER.  Meds ordered this encounter  Medications  . Desvenlafaxine ER 100 MG TB24    Sig: Take 1 tablet (100 mg total) by mouth at bedtime.    Dispense:  30 tablet    Refill:  5     Follow-up: Return in about 1 month (around 07/16/2015).  Mechele ClaudeWarren Mykenzie Ebanks, M.D.

## 2015-07-07 ENCOUNTER — Ambulatory Visit: Payer: BLUE CROSS/BLUE SHIELD | Admitting: Family Medicine

## 2016-11-25 ENCOUNTER — Encounter: Payer: Self-pay | Admitting: Physician Assistant

## 2016-11-25 ENCOUNTER — Ambulatory Visit (INDEPENDENT_AMBULATORY_CARE_PROVIDER_SITE_OTHER): Payer: Managed Care, Other (non HMO) | Admitting: Physician Assistant

## 2016-11-25 VITALS — BP 136/89 | HR 82 | Temp 99.4°F | Ht 66.0 in | Wt 217.8 lb

## 2016-11-25 DIAGNOSIS — R42 Dizziness and giddiness: Secondary | ICD-10-CM | POA: Diagnosis not present

## 2016-11-25 DIAGNOSIS — J309 Allergic rhinitis, unspecified: Secondary | ICD-10-CM | POA: Diagnosis not present

## 2016-11-25 MED ORDER — FLUTICASONE PROPIONATE 50 MCG/ACT NA SUSP: 2.0000 | Freq: Every day | NASAL | 6 refills | Status: DC

## 2016-11-25 MED ORDER — MECLIZINE HCL 25 MG PO CHEW: 25.0000 mg | CHEWABLE_TABLET | Freq: Four times a day (QID) | ORAL | 1 refills | Status: DC

## 2016-11-25 NOTE — Patient Instructions (Signed)

## 2016-11-27 NOTE — Progress Notes (Signed)
BP 136/89   Pulse 82   Temp 99.4 F (37.4 C) (Oral)   Ht 5\' 6"  (1.676 m)   Wt 217 lb 12.8 oz (98.8 kg)   BMI 35.15 kg/m    Subjective:    Patient ID: Timothy Torres, male    DOB: [birth year], 41 y.o.   MRN: [id number]  HPI: Timothy Torres is a 41 y.o. male presenting on 11/25/2016 for Dizziness  This patient comes in with episodes of dizziness. Started over the weekend. It was worsened by movement. He noticed that predominantly when he went from bending down to standing up. He has noted severe allergies and sinus congestion going on over the past couple weeks. He is never had an issue with vertigo in the past. He was also describing a small right at this child at an amusement park and the spinning action was quite severe for the vertigo.  Relevant past medical, surgical, family and social history reviewed and updated as indicated. Allergies and medications reviewed and updated.  Past Medical History:  Diagnosis Date  . Complication of anesthesia    hard time waking up, n and v  . No pertinent past medical history   . PONV (postoperative nausea and vomiting)     Past Surgical History:  Procedure Laterality Date  . APPENDECTOMY    . LAPAROSCOPIC APPENDECTOMY  04/15/2012   Procedure: APPENDECTOMY LAPAROSCOPIC;  Surgeon: Ardeth Sportsman, MD;  Location: [facility];  Service: General;  Laterality: N/A;    Review of Systems  Constitutional: Positive for fatigue. Negative for appetite change and fever.  HENT: Positive for postnasal drip and rhinorrhea. Negative for ear discharge, sinus pressure and sore throat.   Eyes: Negative.  Negative for pain and visual disturbance.  Respiratory: Negative for cough, chest tightness, shortness of breath and wheezing.   Cardiovascular: Negative.  Negative for chest pain, palpitations and leg swelling.  Gastrointestinal: Negative.  Negative for abdominal pain, diarrhea, nausea and vomiting.  Endocrine: Negative.   Genitourinary: Negative.     Musculoskeletal: Negative for back pain and myalgias.  Skin: Negative.  Negative for color change and rash.  Neurological: Positive for dizziness and headaches. Negative for syncope, weakness and numbness.  Psychiatric/Behavioral: Negative.     Allergies as of 11/25/2016   No Known Allergies     Medication List       Accurate as of 11/25/16 11:59 PM. Always use your most recent med list.          fluticasone 50 MCG/ACT nasal spray Commonly known as:  FLONASE Place 2 sprays into both nostrils daily.   Meclizine HCl 25 MG Chew Chew 1 tablet (25 mg total) by mouth 4 (four) times daily.          Objective:    BP 136/89   Pulse 82   Temp 99.4 F (37.4 C) (Oral)   Ht 5\' 6"  (1.676 m)   Wt 217 lb 12.8 oz (98.8 kg)   BMI 35.15 kg/m   No Known Allergies  Physical Exam  Constitutional: He appears well-developed and well-nourished. No distress.  HENT:  Head: Normocephalic and atraumatic.  Right Ear: A middle ear effusion is present.  Left Ear: A middle ear effusion is present.  Nose: Rhinorrhea present.  Mouth/Throat: Posterior oropharyngeal erythema present.  Eyes: Conjunctivae and EOM are normal. Pupils are equal, round, and reactive to light.  Cardiovascular: Normal rate, regular rhythm and normal heart sounds.   Pulmonary/Chest: Effort normal and breath sounds normal. No respiratory distress.  Skin: Skin is warm and dry.  Psychiatric: He has a normal mood and affect. His behavior is normal.  Nursing note and vitals reviewed.       Assessment & Plan:   1. Vertigo - Meclizine HCl 25 MG CHEW; Chew 1 tablet (25 mg total) by mouth 4 (four) times daily.  Dispense: 60 each; Refill: 1  2. Allergic rhinitis, unspecified seasonality, unspecified trigger - fluticasone (FLONASE) 50 MCG/ACT nasal spray; Place 2 sprays into both nostrils daily.  Dispense: 16 g; Refill: 6   Current Outpatient Prescriptions:  .  fluticasone (FLONASE) 50 MCG/ACT nasal spray, Place 2 sprays  into both nostrils daily., Disp: 16 g, Rfl: 6 .  Meclizine HCl 25 MG CHEW, Chew 1 tablet (25 mg total) by mouth 4 (four) times daily., Disp: 60 each, Rfl: 1 Continue all other maintenance medications as listed above.  Follow up plan: Return if symptoms worsen or fail to improve.  Educational handout given for vertigo  Remus LofflerAngel S. Jana Swartzlander PA-C [facility] [address]  [place], [place] [address] [phone]   11/27/2016, 5:44 PM

## 2016-12-05 ENCOUNTER — Encounter: Payer: Self-pay | Admitting: Nurse Practitioner

## 2016-12-05 ENCOUNTER — Telehealth: Payer: Self-pay | Admitting: Family Medicine

## 2016-12-05 ENCOUNTER — Ambulatory Visit (INDEPENDENT_AMBULATORY_CARE_PROVIDER_SITE_OTHER): Payer: Managed Care, Other (non HMO) | Admitting: Nurse Practitioner

## 2016-12-05 VITALS — BP 127/88 | HR 65 | Temp 97.8°F | Ht 66.0 in | Wt 217.0 lb

## 2016-12-05 DIAGNOSIS — R42 Dizziness and giddiness: Secondary | ICD-10-CM | POA: Diagnosis not present

## 2016-12-05 DIAGNOSIS — H8309 Labyrinthitis, unspecified ear: Secondary | ICD-10-CM

## 2016-12-05 MED ORDER — PREDNISONE 10 MG (21) PO TBPK: ORAL_TABLET | ORAL | 0 refills | Status: DC

## 2016-12-05 NOTE — Patient Instructions (Signed)
Labyrinthitis Labyrinthitis is an infection of the inner ear. Your inner ear is a system of tubes and canals (labyrinth). These are filled with fluid. Nerve cells in your inner ear send signals for hearing and balance to your brain. When tiny germs get inside the tubes and canals, they harm the cells that send messages to the brain. This can cause changes in hearing and balance. Follow these instructions at home:  Take medicines only as told by your doctor.  If you were prescribed an antibiotic medicine, finish all of it even if you start to feel better.  Rest as much as possible.  Avoid loud noises and bright lights.  Do not make sudden movements until any dizziness goes away.  Do not drive until your doctor says that you can.  Drink enough fluid to keep your pee (urine) clear or pale yellow.  Work with a physical therapist if you still feel dizzy after several weeks. A therapist can teach you exercises to help you deal with your dizziness.  Keep all follow-up visits as told by your doctor. This is important. Contact a doctor if:  Your symptoms do not get better with medicines.  You do not get better after two weeks.  You have a fever. Get help right away if:  You are very dizzy.  You keep throwing up (vomiting) or keep feeling sick to your stomach (nauseous).  Your hearing gets a lot worse very quickly. This information is not intended to replace advice given to you by your health care provider. Make sure you discuss any questions you have with your health care provider. Document Released: 07/08/2005 Document Revised: 12/14/2015 Document Reviewed: 04/19/2014 Elsevier Interactive Patient Education  2017 [employer]  

## 2016-12-05 NOTE — Telephone Encounter (Signed)
Scheduled for recheck of vertigo with nausea

## 2016-12-05 NOTE — Progress Notes (Signed)
   Subjective:    Patient ID: Timothy Torres, male    DOB: July 03, 1976, 41 y.o.   MRN: [id number]  HPI  Patient comes in today having been sen by A.Jones PA for vertigo last Monday ( week ago). He was given meclizine which helped some but this morning he got out of bed and fell to his knees with dizziness. Had slight nausea. He said when he got on scales in office he got dizzy.    Review of Systems  Constitutional: Negative for appetite change, chills, fatigue and fever.  HENT: Negative.   Respiratory: Negative.   Cardiovascular: Negative.   Gastrointestinal: Positive for nausea.  Genitourinary: Negative.   Neurological: Positive for dizziness.  Psychiatric/Behavioral: Negative.   All other systems reviewed and are negative.      Objective:   Physical Exam  Constitutional: He is oriented to person, place, and time. He appears well-developed and well-nourished. No distress.  Cardiovascular: Normal rate and regular rhythm.   Pulmonary/Chest: Effort normal.  Neurological: He is alert and oriented to person, place, and time. He has normal reflexes. No cranial nerve deficit.  Skin: Skin is warm.  Psychiatric: He has a normal mood and affect. His behavior is normal. Judgment and thought content normal.    BP 127/88   Pulse 65   Temp 97.8 F (36.6 C) (Oral)   Ht 5\' 6"  (1.676 m)   Wt 217 lb (98.4 kg)   BMI 35.02 kg/m      Assessment & Plan:   1. Labyrinthitis, unspecified laterality   2. Vertigo    Meds ordered this encounter  Medications  . predniSONE (STERAPRED UNI-PAK 21 TAB) 10 MG (21) TBPK tablet    Sig: As directed x 6 days    Dispense:  21 tablet    Refill:  0    Order Specific Question:   Supervising Provider    Answer:   Timothy Torres, Timothy Torres [[id number]]   Continue meclizine as rx Force fluids Rest No driving If no better by Monday will do neuro referral  Timothy Daphine DeutscherMartin, FNP

## 2017-06-09 ENCOUNTER — Encounter: Payer: Self-pay | Admitting: Family

## 2017-06-09 ENCOUNTER — Ambulatory Visit: Payer: Managed Care, Other (non HMO) | Admitting: Family

## 2017-06-09 VITALS — BP 117/75 | HR 84 | Temp 98.7°F | Ht 66.0 in | Wt 223.0 lb

## 2017-06-09 DIAGNOSIS — J029 Acute pharyngitis, unspecified: Secondary | ICD-10-CM

## 2017-06-09 LAB — RAPID STREP SCREEN (MED CTR MEBANE ONLY): STREP GP A AG, IA W/REFLEX: NEGATIVE

## 2017-06-09 LAB — CULTURE, GROUP A STREP

## 2017-06-09 MED ORDER — AMOXICILLIN 875 MG PO TABS: 875.0000 mg | ORAL_TABLET | Freq: Two times a day (BID) | ORAL | 0 refills | Status: DC

## 2017-06-09 NOTE — Progress Notes (Signed)
   Subjective:    Patient ID: Timothy Torres, male    DOB: [birth year], 41 y.o.   MRN: [id number]  Sore Throat   This is a new problem. The current episode started 1 to 4 weeks ago. The problem has been unchanged. The pain is worse on the left side. There has been no fever. The pain is at a severity of 5/10. The pain is mild. Associated symptoms include a hoarse voice, swollen glands and trouble swallowing. Pertinent negatives include no congestion, coughing, ear discharge, ear pain, headaches or neck pain. He has tried acetaminophen, gargles and NSAIDs for the symptoms. The treatment provided mild relief.      Review of Systems  HENT: Positive for hoarse voice and trouble swallowing. Negative for congestion, ear discharge and ear pain.   Respiratory: Negative for cough.   Musculoskeletal: Negative for neck pain.  Neurological: Negative for headaches.       Objective:   Physical Exam  Constitutional: He is oriented to person, place, and time. He appears well-developed and well-nourished. No distress.  HENT:  Head: Normocephalic.  Right Ear: External ear normal.  Left Ear: External ear normal.  Nose: Mucosal edema and rhinorrhea present.  Mouth/Throat: Oropharyngeal exudate, posterior oropharyngeal edema and posterior oropharyngeal erythema present.  Eyes: Pupils are equal, round, and reactive to light. Right eye exhibits no discharge. Left eye exhibits no discharge.  Neck: Normal range of motion. Neck supple. No thyromegaly present.  Cardiovascular: Normal rate, regular rhythm, normal heart sounds and intact distal pulses.  No murmur heard. Pulmonary/Chest: Effort normal and breath sounds normal. No respiratory distress. He has no wheezes.  Abdominal: Soft. Bowel sounds are normal. He exhibits no distension. There is no tenderness.  Musculoskeletal: Normal range of motion. He exhibits no edema or tenderness.  Neurological: He is alert and oriented to person, place, and time.  Skin:  Skin is warm and dry. No rash noted. No erythema.  Psychiatric: He has a normal mood and affect. His behavior is normal. Judgment and thought content normal.  Vitals reviewed.     BP 117/75   Pulse 84   Temp 98.7 F (37.1 C) (Oral)   Ht 5\' 6"  (1.676 m)   Wt 223 lb (101.2 kg)   BMI 35.99 kg/m      Assessment & Plan:  1. Sore throat - Rapid Strep Screen (Not at [facility])  2. Acute pharyngitis, unspecified etiology - Take meds as prescribed - Use a cool mist humidifier  -Use saline nose sprays frequently -Saline irrigations of the nose can be very helpful if done frequently. -Force fluids -For fever or aces or pains- take tylenol or ibuprofen appropriate for age and weight. -Throat lozenges if help -New toothbrush in 3 days - amoxicillin (AMOXIL) 875 MG tablet; Take 1 tablet (875 mg total) 2 (two) times daily by mouth.  Dispense: 20 tablet; Refill: 0   Jannifer Rodneyhristy [place] Silveria, FNP

## 2017-06-09 NOTE — Patient Instructions (Signed)
Strep Throat Strep throat is a bacterial infection of the throat. Your health care provider may call the infection tonsillitis or pharyngitis, depending on whether there is swelling in the tonsils or at the back of the throat. Strep throat is most common during the cold months of the year in children who are 5-39 years of age, but it can happen during any season in people of any age. This infection is spread from person to person (contagious) through coughing, sneezing, or close contact. What are the causes? Strep throat is caused by the bacteria called Streptococcus pyogenes. What increases the risk? This condition is more likely to develop in:  People who spend time in crowded places where the infection can spread easily.  People who have close contact with someone who has strep throat.  What are the signs or symptoms? Symptoms of this condition include:  Fever or chills.  Redness, swelling, or pain in the tonsils or throat.  Pain or difficulty when swallowing.  White or yellow spots on the tonsils or throat.  Swollen, tender glands in the neck or under the jaw.  Red rash all over the body (rare).  How is this diagnosed? This condition is diagnosed by performing a rapid strep test or by taking a swab of your throat (throat culture test). Results from a rapid strep test are usually ready in a few minutes, but throat culture test results are available after one or two days. How is this treated? This condition is treated with antibiotic medicine. Follow these instructions at home: Medicines  Take over-the-counter and prescription medicines only as told by your health care provider.  Take your antibiotic as told by your health care provider. Do not stop taking the antibiotic even if you start to feel better.  Have family members who also have a sore throat or fever tested for strep throat. They may need antibiotics if they have the strep infection. Eating and drinking  Do not  share food, drinking cups, or personal items that could cause the infection to spread to other people.  If swallowing is difficult, try eating soft foods until your sore throat feels better.  Drink enough fluid to keep your urine clear or pale yellow. General instructions  Gargle with a salt-water mixture 3-4 times per day or as needed. To make a salt-water mixture, completely dissolve -1 tsp of salt in 1 cup of warm water.  Make sure that all household members wash their hands well.  Get plenty of rest.  Stay home from school or work until you have been taking antibiotics for 24 hours.  Keep all follow-up visits as told by your health care provider. This is important. Contact a health care provider if:  The glands in your neck continue to get bigger.  You develop a rash, cough, or earache.  You cough up a thick liquid that is green, yellow-brown, or bloody.  You have pain or discomfort that does not get better with medicine.  Your problems seem to be getting worse rather than better.  You have a fever. Get help right away if:  You have new symptoms, such as vomiting, severe headache, stiff or painful neck, chest pain, or shortness of breath.  You have severe throat pain, drooling, or changes in your voice.  You have swelling of the neck, or the skin on the neck becomes red and tender.  You have signs of dehydration, such as fatigue, dry mouth, and decreased urination.  You become increasingly sleepy, or   you cannot wake up completely.  Your joints become red or painful. This information is not intended to replace advice given to you by your health care provider. Make sure you discuss any questions you have with your health care provider. Document Released: 07/05/2000 Document Revised: 03/06/2016 Document Reviewed: 10/31/2014 Elsevier Interactive Patient Education  2017 Elsevier Inc.  

## 2017-06-19 ENCOUNTER — Telehealth: Payer: Self-pay | Admitting: Family Medicine

## 2017-06-19 NOTE — Telephone Encounter (Signed)
Please review and advise- patient is still on amoxil and states he is no better.

## 2017-06-19 NOTE — Telephone Encounter (Signed)
Okay to refill? 

## 2017-06-20 ENCOUNTER — Other Ambulatory Visit: Payer: Self-pay | Admitting: Family Medicine

## 2017-06-20 MED ORDER — METRONIDAZOLE 500 MG PO TABS: 500.0000 mg | ORAL_TABLET | Freq: Three times a day (TID) | ORAL | 0 refills | Status: DC

## 2017-06-20 NOTE — Telephone Encounter (Signed)
Patient aware and states that the antibiotic is not helping any and he would like something else sent into the pharmacy. [facility] [place]. If approved send in something else.

## 2017-06-20 NOTE — Telephone Encounter (Signed)
I sent in the requested prescription 

## 2017-06-20 NOTE — Telephone Encounter (Signed)
Patient aware.

## 2017-10-10 ENCOUNTER — Ambulatory Visit: Payer: Managed Care, Other (non HMO) | Admitting: Family Medicine

## 2017-10-10 ENCOUNTER — Encounter: Payer: Self-pay | Admitting: Family Medicine

## 2017-10-10 ENCOUNTER — Other Ambulatory Visit: Payer: Self-pay | Admitting: *Deleted

## 2017-10-10 VITALS — BP 135/89 | HR 76 | Temp 98.3°F | Ht 66.0 in | Wt 207.0 lb

## 2017-10-10 DIAGNOSIS — F418 Other specified anxiety disorders: Secondary | ICD-10-CM

## 2017-10-10 DIAGNOSIS — G8929 Other chronic pain: Secondary | ICD-10-CM

## 2017-10-10 DIAGNOSIS — R634 Abnormal weight loss: Secondary | ICD-10-CM | POA: Diagnosis not present

## 2017-10-10 DIAGNOSIS — Z114 Encounter for screening for human immunodeficiency virus [HIV]: Secondary | ICD-10-CM | POA: Diagnosis not present

## 2017-10-10 DIAGNOSIS — Z13220 Encounter for screening for lipoid disorders: Secondary | ICD-10-CM

## 2017-10-10 DIAGNOSIS — M25511 Pain in right shoulder: Secondary | ICD-10-CM

## 2017-10-10 DIAGNOSIS — Z8349 Family history of other endocrine, nutritional and metabolic diseases: Secondary | ICD-10-CM | POA: Insufficient documentation

## 2017-10-10 MED ORDER — ESCITALOPRAM OXALATE 10 MG PO TABS: 10.0000 mg | ORAL_TABLET | Freq: Every day | ORAL | 0 refills | Status: DC

## 2017-10-10 MED ORDER — METHYLPREDNISOLONE ACETATE 40 MG/ML IJ SUSP
40.0000 mg | Freq: Once | INTRAMUSCULAR | Status: AC
  Administered 2017-10-10: 40 mg via INTRAMUSCULAR

## 2017-10-10 NOTE — Addendum Note (Signed)
Addended by: Dory PeruINTELMANN, Aulani Shipton C on: 10/10/2017 04:37 PM   Modules accepted: Orders

## 2017-10-10 NOTE — Patient Instructions (Addendum)
We are checking your thyroid today to make sure that this is not what you are anxiety and weight loss are coming from.  I have prescribed the Lexapro 10 mg to take every day.  Follow-up with me in the next 3-4 weeks for recheck.   He has not had labs done since 2016.  I have gone ahead and added a cholesterol panel, liver function panel, kidney function panel and anemia panel to your labs.  I will contact you once the labs are available.   Taking the medicine as directed and not missing any doses is one of the best things you can do to treat your depression.  Here are some things to keep in mind:  1) Side effects (stomach upset, some increased anxiety) may happen before you notice a benefit.  These side effects typically go away over time. 2) Changes to your dose of medicine or a change in medication all together is sometimes necessary 3) Most people need to be on medication at least 12 months 4) Many people will notice an improvement within two weeks but the full effect of the medication can take up to 4-6 weeks 5) Stopping the medication when you start feeling better often results in a return of symptoms 6) Never discontinue your medication without contacting a health care professional first.  Some medications require gradual discontinuation/ taper and can make you sick if you stop them abruptly.  If your symptoms worsen or you have thoughts of suicide/homicide, PLEASE SEEK IMMEDIATE MEDICAL ATTENTION.  You may always call:  National Suicide Hotline: 443-874-0703 [place] Crisis Line: (224)640-7695 Crisis Recovery in [place]: 989-477-0029   These are available 24 hours a day, 7 days a week.   Shoulder Injection, Care After Refer to this sheet in the next few weeks. These instructions provide you with information about caring for yourself after your procedure. Your health care provider may also give you more specific instructions. Your treatment has been planned according to  current medical practices, but problems sometimes occur. Call your health care provider if you have any problems or questions after your procedure. What can I expect after the procedure? After the procedure, it is common to have:  Soreness.  Warmth.  Swelling.  You may have more pain, swelling, and warmth than you did before the injection. This reaction may last for about one day. Follow these instructions at home: Bathing  If you were given a bandage (dressing), keep it dry until your health care provider says it can be removed. Ask your health care provider when you can start showering or taking a bath. Managing pain, stiffness, and swelling  If directed, apply ice to the injection area: ? Put ice in a plastic bag. ? Place a towel between your skin and the bag. ? Leave the ice on for 20 minutes, 2-3 times per day.  Do not apply heat to your knee.  Raise the injection area above the level of your heart while you are sitting or lying down. Activity  Avoid strenuous activities for as long as directed by your health care provider. Ask your health care provider when you can return to your normal activities. General instructions  Take medicines only as directed by your health care provider.  Do not take aspirin or other over-the-counter medicines unless your health care provider says you can.  Check your injection site every day for signs of infection. Watch for: ? Redness, swelling, or pain. ? Fluid, blood, or pus.  Follow  your health care provider's instructions about dressing changes and removal. Contact a health care provider if:  You have symptoms at your injection site that last longer than two days after your procedure.  You have redness, swelling, or pain in your injection area.  You have fluid, blood, or pus coming from your injection site.  You have warmth in your injection area.  You have a fever.  Your pain is not controlled with medicine. Get help right  away if:  Your knee turns very red.  Your knee becomes very swollen.  Your knee pain is severe. This information is not intended to replace advice given to you by your health care provider. Make sure you discuss any questions you have with your health care provider. Document Released: 07/29/2014 Document Revised: 03/13/2016 Document Reviewed: 05/18/2014 Elsevier Interactive Patient Education  [employer]2018 Elsevier Inc.

## 2017-10-10 NOTE — Progress Notes (Signed)
Subjective: CC: Establish care, weight loss/ anxiety, right shoulder pain PCP: Janora Norlander, DO [id number] Detjen is a 42 y.o. male presenting to clinic today for:  1. Anxiety/ weight loss Patient reports that he has had about 8 months of anxiety/stress.  He notes that he has had weight loss over the last 2-4 weeks, which she attributes to anxiety symptoms.  He notes difficulty falling asleep.  He notes diarrhea.  He notes that he feels easily fatigued.  Denies shortness of breath or heart palpitations.  No SI, HI, visual or auditory hallucinations.  He notes that he was previously treated for stress and difficulty with concentration with Lexapro many years ago.  He tolerated this medication well and is interested in revisiting it for his current symptoms.  Patient denies previous hospitalizations for mental health disorder.  Family history is significant for thyroid disorder in his mother and father.  Positive for depression in his mother and sister.  No family history of schizophrenia or bipolar disorder.  2.  Right shoulder pain Patient notes several year history of right shoulder pain.  He describes the pain as sharp and exacerbated by overhead movements and lifting.  He feels that he is weaker in the right upper extremity.  He is right-hand dominant.  Denies any preceding injury.  No history of surgery to the right shoulder.  He did fall out of a tree about a month and a half ago but notes that he fell on his left side.  No numbness and tingling of the right upper extremity.  He has been using Aleve with some relief of symptoms.   ROS: Per HPI  No Known Allergies Past Medical History:  Diagnosis Date  . Complication of anesthesia    hard time waking up, n and v  . No pertinent past medical history   . PONV (postoperative nausea and vomiting)     Current Outpatient Medications:  .  escitalopram (LEXAPRO) 10 MG tablet, Take 1 tablet (10 mg total) by mouth daily., Disp: 30  tablet, Rfl: 0 Social History   Socioeconomic History  . Marital status: Married    Spouse name: Not on file  . Number of children: Not on file  . Years of education: Not on file  . Highest education level: Not on file  Occupational History  . Not on file  Social Needs  . Financial resource strain: Not on file  . Food insecurity:    Worry: Not on file    Inability: Not on file  . Transportation needs:    Medical: Not on file    Non-medical: Not on file  Tobacco Use  . Smoking status: Never Smoker  . Smokeless tobacco: Never Used  Substance and Sexual Activity  . Alcohol use: No  . Drug use: No  . Sexual activity: Not on file  Lifestyle  . Physical activity:    Days per week: Not on file    Minutes per session: Not on file  . Stress: Not on file  Relationships  . Social connections:    Talks on phone: Not on file    Gets together: Not on file    Attends religious service: Not on file    Active member of club or organization: Not on file    Attends meetings of clubs or organizations: Not on file    Relationship status: Not on file  . Intimate partner violence:    Fear of current or ex partner: Not on file  Emotionally abused: Not on file    Physically abused: Not on file    Forced sexual activity: Not on file  Other Topics Concern  . Not on file  Social History Narrative  . Not on file   Family History  Problem Relation Age of Onset  . Hypertension Mother   . Hypertension Father   . Heart disease Maternal Grandfather   . Stroke Paternal Grandfather   . Cancer Neg Hx   . Diabetes Neg Hx     Objective: Office vital signs reviewed. BP 135/89   Pulse 76   Temp 98.3 F (36.8 C) (Oral)   Ht 5' 6"  (1.676 m)   Wt 207 lb (93.9 kg)   BMI 33.41 kg/m   Physical Examination:  General: Awake, alert, well appearing male, No acute distress HEENT: Normal    Neck: No masses palpated. No lymphadenopathy; no palpable thyroid nodules    Ears: Tympanic membranes  intact, normal light reflex, no erythema, no bulging    Eyes: PERRLA, extraocular membranes intact, sclera white, no exophthalmos    Throat: moist mucus membranes Cardio: regular rate and rhythm, S1S2 heard, no murmurs appreciated Pulm: clear to auscultation bilaterally, no wheezes, rhonchi or rales; normal work of breathing on room air Extremities: warm, well perfused, No edema, cyanosis or clubbing; +2 pulses bilaterally MSK: Normal gait and normal station  Right shoulder: Patient has full active range of motion of bilateral upper extremity's.  He does have a painful arc noted on the right.  He has point tenderness to palpation along the lateral aspect of the right shoulder.  No palpable deformities.  No overlying erythema or joint effusion. 5/5 upper extremity strength in all planes.  He does have a positive empty can on the right.  Skin: dry; intact; no rashes or lesions Neuro: 5/5 UE Strength and light touch sensation grossly intact. No resting tremor noted. Psych: Mood stable, speech normal, appropriate affect, good eye contact, does not appear to be responding to internal stimuli.   Office Visit from 10/10/2017 in [place]  PHQ-2 Total Score  0     GAD 7 : Generalized Anxiety Score 10/10/2017 05/15/2015  Nervous, Anxious, on Edge 3 0  Control/stop worrying 3 0  Worry too much - different things 3 0  Trouble relaxing 3 0  Restless 0 0  Easily annoyed or irritable 2 0  Afraid - awful might happen 1 0  Total GAD 7 Score 15 0  Anxiety Difficulty Somewhat difficult -   JOINT INJECTION:  Patient denies allergy to antiseptics (including iodine) and anesthetics  Patient denies h/o diabetes, frequent steroid use, use of blood thinners/ antiplatelets.  Patient was given informed consent and a signed copy has been placed in the chart. Appropriate time out was taken. Area prepped and draped in usual sterile fashion. Anatomic landmarks were identified and injection  site was marked.  Ethyl chloride spray was used to numb the area and 1 cc of methylprednisolone 40 mg/ml plus 3 cc of 1% lidocaine without epinephrine was injected into the right shoulder using a(n) posterior approach. The patient tolerated the procedure well and there were no immediate complications. Estimated blood loss is less than 1 cc.  Post procedure instructions were reviewed and handout outlining these instructions were provided to patient.   Assessment/ Plan: 42 y.o. male   1. Depression with anxiety PHQ 9 score 0 but gad 7 score 15.  This is a positive screening test for anxiety disorder.  I do question whether or not his anxiety is mediated by an underlying thyroid disorder.  He has had about a 10 pound weight loss per his report over the last month and a half.  He notes poor sleep and anxiety symptoms.  Family history is positive for both parents having thyroid disorders.  Will check TSH, CMP, CBC.  In the interim, patient wishes to initiate Lexapro.  I started him on Lexapro 10 mg daily.  He will follow-up in the next 3-4 weeks.  I will contact him with the results of his labs once they are available.  2. Chronic right shoulder pain I question impingement versus supraspinatus tendinitis.  He had a positive empty can and a positive painful arc test on exam.  Strength is intact.  He was given a corticosteroid injection to the right shoulder.  Patient tolerated procedure well.  See above note.  He will follow-up in 3-4 weeks as above.  If persistent symptoms or for any reason worsening symptoms, he will contact me in a referral will be placed to orthopedics for further evaluation and management.  3. Weight loss Difficult to tell if this is related to mental health versus metabolic disorder.  Will check labs to further evaluate. - CMP14+EGFR - CBC with Differential - TSH  4. Family history of thyroid disease - TSH  5. Screening for lipid disorders - Lipid Panel  6. Screening for HIV  (human immunodeficiency virus) - HIV antibody   Orders Placed This Encounter  Procedures  . CMP14+EGFR  . CBC with Differential  . TSH  . HIV antibody  . Lipid Panel   Meds ordered this encounter  Medications  . escitalopram (LEXAPRO) 10 MG tablet    Sig: Take 1 tablet (10 mg total) by mouth daily.    Dispense:  30 tablet    Refill:  [place], DO [place] [phone]

## 2017-10-11 LAB — CMP14+EGFR
A/G RATIO: 1.9 (ref 1.2–2.2)
ALT: 26 IU/L (ref 0–44)
AST: 19 IU/L (ref 0–40)
Albumin: 4.8 g/dL (ref 3.5–5.5)
Alkaline Phosphatase: 51 IU/L (ref 39–117)
BUN/Creatinine Ratio: 10 (ref 9–20)
BUN: 11 mg/dL (ref 6–20)
Bilirubin Total: 0.3 mg/dL (ref 0.0–1.2)
CO2: 20 mmol/L (ref 20–29)
Calcium: 9.3 mg/dL (ref 8.7–10.2)
Chloride: 105 mmol/L (ref 96–106)
Creatinine, Ser: 1.15 mg/dL (ref 0.76–1.27)
GFR calc Af Amer: 92 mL/min/{1.73_m2} (ref >59)
GFR calc non Af Amer: 80 mL/min/{1.73_m2} (ref >59)
GLOBULIN, TOTAL: 2.5 g/dL (ref 1.5–4.5)
Glucose: 103 mg/dL — ABNORMAL HIGH (ref 65–99)
POTASSIUM: 3.7 mmol/L (ref 3.5–5.2)
SODIUM: 142 mmol/L (ref 134–144)
Total Protein: 7.3 g/dL (ref 6.0–8.5)

## 2017-10-11 LAB — LIPID PANEL
CHOL/HDL RATIO: 3.6 ratio (ref 0.0–5.0)
Cholesterol, Total: 154 mg/dL (ref 100–199)
HDL: 43 mg/dL (ref >39)
LDL Calculated: 75 mg/dL (ref 0–99)
TRIGLYCERIDES: 178 mg/dL — AB (ref 0–149)
VLDL Cholesterol Cal: 36 mg/dL (ref 5–40)

## 2017-10-11 LAB — CBC WITH DIFFERENTIAL/PLATELET
BASOS ABS: 0.1 10*3/uL (ref 0.0–0.2)
Basos: 1 %
EOS (ABSOLUTE): 0.1 10*3/uL (ref 0.0–0.4)
Eos: 1 %
Hematocrit: 43.3 % (ref 37.5–51.0)
Hemoglobin: 14.6 g/dL (ref 13.0–17.7)
IMMATURE GRANS (ABS): 0 10*3/uL (ref 0.0–0.1)
IMMATURE GRANULOCYTES: 0 %
Lymphocytes Absolute: 2.9 10*3/uL (ref 0.7–3.1)
Lymphs: 33 %
MCH: 31.9 pg (ref 26.6–33.0)
MCHC: 33.7 g/dL (ref 31.5–35.7)
MCV: 95 fL (ref 79–97)
MONOS ABS: 0.5 10*3/uL (ref 0.1–0.9)
Monocytes: 5 %
NEUTROS PCT: 60 %
Neutrophils Absolute: 5.1 10*3/uL (ref 1.4–7.0)
PLATELETS: 263 10*3/uL (ref 150–379)
RBC: 4.57 x10E6/uL (ref 4.14–5.80)
RDW: 13.6 % (ref 12.3–15.4)
WBC: 8.6 10*3/uL (ref 3.4–10.8)

## 2017-10-11 LAB — TSH: TSH: 2.09 u[IU]/mL (ref 0.450–4.500)

## 2017-10-11 LAB — HIV ANTIBODY (ROUTINE TESTING W REFLEX): HIV Screen 4th Generation wRfx: NONREACTIVE

## 2017-10-29 NOTE — Progress Notes (Signed)
Subjective: CC: Depression/ Anxiety PCP: Raliegh IpGottschalk, Ashly M, DO ZOX:WRUEAVHPI:Timothy Torres ButtBenfield is a 42 y.o. male presenting to clinic today for:  1. Depression/ Anxiety/ Sleep difficulty Patient was seen 1 month ago for depression/anxiety symptoms.  He was prescribed Lexapro 10 mg daily.  He presents today for follow-up on this medication.  He notes that the medication has been helping.  He does notice a difference in mood and notes that he is not as easily angered or frustrated.  He denies any significant GI symptoms.  He notes that his sex life has improved with medication.  He continues to have difficulty with sleep, particularly staying asleep at nighttime.  He notes he has no problems falling asleep.  He is in bed on average about 7-1/2 hours per night but is not often fully asleep during this time.  He denies excessive caffeine intake.  He denies nocturia.  No snoring, gasping, coughing at nighttime.  No apnea.  2. Right shoulder pain Patient reports significant improvement in the right shoulder pain he was seen for about 1 month ago.  He had a corticosteroid injection at that time.  He notes that he does not really feel pain in the right shoulder but activities such as throwing a football feel like he has something catching in the shoulder.  Denies swelling, numbness or tingling or weakness.  He is not quite sure that he wants to see orthopedist for this yet.  He just wanted to mention that this was occurring.  ROS: Per HPI  No Known Allergies Past Medical History:  Diagnosis Date  . Complication of anesthesia    hard time waking up, n and v  . No pertinent past medical history   . PONV (postoperative nausea and vomiting)     Current Outpatient Medications:  .  escitalopram (LEXAPRO) 10 MG tablet, Take 1 tablet (10 mg total) by mouth daily., Disp: 30 tablet, Rfl: 0 Social History   Socioeconomic History  . Marital status: Married    Spouse name: Not on file  . Number of children: Not on  file  . Years of education: Not on file  . Highest education level: Not on file  Occupational History  . Not on file  Social Needs  . Financial resource strain: Not on file  . Food insecurity:    Worry: Not on file    Inability: Not on file  . Transportation needs:    Medical: Not on file    Non-medical: Not on file  Tobacco Use  . Smoking status: Never Smoker  . Smokeless tobacco: Never Used  Substance and Sexual Activity  . Alcohol use: No  . Drug use: No  . Sexual activity: Not on file  Lifestyle  . Physical activity:    Days per week: Not on file    Minutes per session: Not on file  . Stress: Not on file  Relationships  . Social connections:    Talks on phone: Not on file    Gets together: Not on file    Attends religious service: Not on file    Active member of club or organization: Not on file    Attends meetings of clubs or organizations: Not on file    Relationship status: Not on file  . Intimate partner violence:    Fear of current or ex partner: Not on file    Emotionally abused: Not on file    Physically abused: Not on file    Forced sexual activity: Not  on file  Other Topics Concern  . Not on file  Social History Narrative  . Not on file   Family History  Problem Relation Age of Onset  . Hypertension Mother   . Thyroid disease Mother   . Depression Mother   . Hypertension Father   . Thyroid disease Father   . Heart disease Maternal Grandfather   . Stroke Paternal Grandfather   . Depression Sister   . Cancer Neg Hx   . Diabetes Neg Hx     Objective: Office vital signs reviewed. BP 137/84   Pulse 83   Temp 98.6 F (37 C) (Oral)   Ht 5\' 6"  (1.676 m)   Wt 204 lb (92.5 kg)   BMI 32.93 kg/m   Physical Examination:  General: Awake, alert, well nourished, No acute distress Psych: Mood stable, speech normal, affect appropriate, good eye contact, pleasant.  Depression screen [facility] 2/9 10/31/2017 10/10/2017 06/09/2017  Decreased Interest 2 0 0    Down, Depressed, Hopeless 1 0 2  PHQ - 2 Score 3 0 2  Altered sleeping 2 - 1  Tired, decreased energy 2 - 1  Change in appetite 2 - 1  Feeling bad or failure about yourself  1 - 1  Trouble concentrating 2 - 1  Moving slowly or fidgety/restless 0 - 0  Suicidal thoughts 0 - 0  PHQ-9 Score 12 - 7  Difficult doing work/chores - - -   GAD 7 : Generalized Anxiety Score 10/31/2017 10/10/2017 05/15/2015  Nervous, Anxious, on Edge 1 3 0  Control/stop worrying 2 3 0  Worry too much - different things 2 3 0  Trouble relaxing 2 3 0  Restless 1 0 0  Easily annoyed or irritable 1 2 0  Afraid - awful might happen 1 1 0  Total GAD 7 Score 10 15 0  Anxiety Difficulty Somewhat difficult Somewhat difficult -      Assessment/ Plan: 42 y.o. male   1. Depression with anxiety PHQ 9 with a score of 12.  His gad 7 has decreased to 10 from 15.  Tolerating Lexapro without difficulty.  No sexual side effects.  Does wish to increase dose.  Lexapro increased to 20 mg daily.  Follow-up in the next 4-6 weeks for recheck.  Call sooner if needed.  2. Chronic right shoulder pain Pain is well controlled at this time.  He continues to have a catching sensation, with extension of the right upper extremity.  We discussed that we could have him evaluated by orthopedist under ultrasound to evaluate the structure of the right shoulder.  Patient would like to hold off on this for now and discuss again in future if needed.  3. Sleep difficulties We discussed options.  I think that sleep will probably improve with anxiety control.  May start melatonin nightly.  Sleep hygiene was already discussed at previous visit but this was reinforced today.  We discussed alternative options as well including Ambien and trazodone.  He does not wish to be on extra medications if possible.  We will readdress this at our next visit and if sleep is still an issue, we can consider adding medication.  Meds ordered this encounter  Medications   . escitalopram (LEXAPRO) 20 MG tablet    Sig: Take 1 tablet (20 mg total) by mouth daily.    Dispense:  90 tablet    Refill:  0     Ashly Hulen Skains, DO [facility] [phone]

## 2017-10-31 ENCOUNTER — Ambulatory Visit: Payer: Managed Care, Other (non HMO) | Admitting: Family Medicine

## 2017-10-31 ENCOUNTER — Encounter: Payer: Self-pay | Admitting: Family Medicine

## 2017-10-31 VITALS — BP 137/84 | HR 83 | Temp 98.6°F | Ht 66.0 in | Wt 204.0 lb

## 2017-10-31 DIAGNOSIS — M25511 Pain in right shoulder: Secondary | ICD-10-CM | POA: Diagnosis not present

## 2017-10-31 DIAGNOSIS — G8929 Other chronic pain: Secondary | ICD-10-CM

## 2017-10-31 DIAGNOSIS — F418 Other specified anxiety disorders: Secondary | ICD-10-CM | POA: Diagnosis not present

## 2017-10-31 DIAGNOSIS — G479 Sleep disorder, unspecified: Secondary | ICD-10-CM | POA: Diagnosis not present

## 2017-10-31 MED ORDER — ESCITALOPRAM OXALATE 20 MG PO TABS: 20.0000 mg | ORAL_TABLET | Freq: Every day | ORAL | 0 refills | Status: DC

## 2017-12-12 ENCOUNTER — Ambulatory Visit: Payer: Managed Care, Other (non HMO) | Admitting: Family Medicine

## 2017-12-12 ENCOUNTER — Encounter: Payer: Self-pay | Admitting: Family Medicine

## 2017-12-12 VITALS — BP 100/64 | HR 77 | Temp 98.5°F | Ht 66.0 in | Wt 207.0 lb

## 2017-12-12 DIAGNOSIS — F418 Other specified anxiety disorders: Secondary | ICD-10-CM

## 2017-12-12 DIAGNOSIS — G479 Sleep disorder, unspecified: Secondary | ICD-10-CM

## 2017-12-12 DIAGNOSIS — M25511 Pain in right shoulder: Secondary | ICD-10-CM | POA: Diagnosis not present

## 2017-12-12 DIAGNOSIS — G8929 Other chronic pain: Secondary | ICD-10-CM | POA: Diagnosis not present

## 2017-12-12 MED ORDER — TRAZODONE HCL 50 MG PO TABS: 25.0000 mg | ORAL_TABLET | Freq: Every evening | ORAL | Status: DC | PRN

## 2017-12-12 MED ORDER — ESCITALOPRAM OXALATE 20 MG PO TABS: 20.0000 mg | ORAL_TABLET | Freq: Every day | ORAL | 3 refills | Status: DC

## 2017-12-12 NOTE — Assessment & Plan Note (Signed)
Patient to start trazodone 25 to 50 mg nightly as needed sleep.  He will contact me within the next couple of weeks to let me know how he is doing on this medication.  If he is stable, plan to see each other back in 3 months otherwise see each other in 4 to 6 weeks or sooner if needed.  At that point, would consider referral for evaluation for sleep apnea. STOP BANG score 2.

## 2017-12-12 NOTE — Progress Notes (Signed)
Subjective: CC: Depression/ Anxiety PCP: Raliegh Ip, DO Timothy Torres is a 42 y.o. male presenting to clinic today for:  1. Depression/ Anxiety/ Sleep difficulty Lexapro was increased to 20 mg at last visit.  He presents today for follow-up on this medication.  He notes that the medication has been helping.  Patient was instructed to start melatonin at bedtime at last visit.  He follows up today and notes that he never started the melatonin but did purchase a new bed and has got an extra fan in his room.  He does note that he had one good night of sleep in the last week.  He continues to have poor energy and feels that he ruminates quite a bit at nighttime.  He does report that overall his anxiety depression has gotten better and he is pleased with how Lexapro 20 mg is working for him.  No SI, HI, auditory or visual hallucinations.  No GI side effects.  He is working on getting a new job and actually just had 2 interviews, which he thinks are promising.  He identifies his job as a leading cause of stress and anxiety.  He thinks once he is able to get out of current job he will actually do quite a bit better.  Additionally, he does note some snoring but denies any apneic spells.  2.  Chronic right shoulder pain Patient reports that he started having worsening right shoulder pain since last visit.  Pain is described as sharp.  Patient is right-hand dominant.  The corticosteroid is no longer helping.  He has not been taking any oral anti-inflammatories or analgesics.  He does note that he has difficulty raising the arm above chest level secondary to pain.  No numbness or tingling.  He is ready to see the orthopedist for this.  ROS: Per HPI  No Known Allergies Past Medical History:  Diagnosis Date  . Complication of anesthesia    hard time waking up, n and v  . No pertinent past medical history   . PONV (postoperative nausea and vomiting)     Current Outpatient Medications:  .   escitalopram (LEXAPRO) 20 MG tablet, Take 1 tablet (20 mg total) by mouth daily., Disp: 90 tablet, Rfl: 0 Social History   Socioeconomic History  . Marital status: Married    Spouse name: Not on file  . Number of children: Not on file  . Years of education: Not on file  . Highest education level: Not on file  Occupational History  . Not on file  Social Needs  . Financial resource strain: Not on file  . Food insecurity:    Worry: Not on file    Inability: Not on file  . Transportation needs:    Medical: Not on file    Non-medical: Not on file  Tobacco Use  . Smoking status: Never Smoker  . Smokeless tobacco: Never Used  Substance and Sexual Activity  . Alcohol use: No  . Drug use: No  . Sexual activity: Not on file  Lifestyle  . Physical activity:    Days per week: Not on file    Minutes per session: Not on file  . Stress: Not on file  Relationships  . Social connections:    Talks on phone: Not on file    Gets together: Not on file    Attends religious service: Not on file    Active member of club or organization: Not on file    Attends  meetings of clubs or organizations: Not on file    Relationship status: Not on file  . Intimate partner violence:    Fear of current or ex partner: Not on file    Emotionally abused: Not on file    Physically abused: Not on file    Forced sexual activity: Not on file  Other Topics Concern  . Not on file  Social History Narrative  . Not on file   Family History  Problem Relation Age of Onset  . Hypertension Mother   . Thyroid disease Mother   . Depression Mother   . Hypertension Father   . Thyroid disease Father   . Heart disease Maternal Grandfather   . Stroke Paternal Grandfather   . Depression Sister   . Cancer Neg Hx   . Diabetes Neg Hx     Objective: Office vital signs reviewed. BP 100/64 (BP Location: Left Arm)   Pulse 77   Temp 98.5 F (36.9 C) (Oral)   Ht  (1.676 m)   Wt 207 lb (93.9 kg)   BMI 33.41  kg/m   Physical Examination:  General: Awake, alert, well nourished, No acute distress Cardio: Regular rate and rhythm.  No murmurs. Pulm: Clear to auscultation bilaterally.  Normal work of breathing on room air. MSK: Patient has reduction in active range of motion to 90 degrees in flexion and abduction.  He is unable to internally rotate the shoulder or externally rotate the shoulder without pain.  He has point tenderness to the anterior lateral aspect of the shoulder.  No palpable deformities.  No joint swelling or discoloration appreciated.  There is an audible click when he ABducts/flexes the shoulder. Psych: Mood stable, speech normal, affect appropriate, good eye contact, pleasant.  Depression screen [facility] 2/9 12/12/2017 10/31/2017 10/10/2017  Decreased Interest 1 2 0  Down, Depressed, Hopeless 1 1 0  PHQ - 2 Score 2 3 0  Altered sleeping 0 2 -  Tired, decreased energy 0 2 -  Change in appetite 0 2 -  Feeling bad or failure about yourself  0 1 -  Trouble concentrating 0 2 -  Moving slowly or fidgety/restless 0 0 -  Suicidal thoughts 0 0 -  PHQ-9 Score 2 12 -  Difficult doing work/chores Somewhat difficult - -   GAD 7 : Generalized Anxiety Score 12/12/2017 10/31/2017 10/10/2017 05/15/2015  Nervous, Anxious, on Edge 0  Control/stop worrying 0  Worry too much - different things 0  Trouble relaxing 0  Restless 2 1 0 0  Easily annoyed or irritable 0  Afraid - awful might happen 0 1 1 0  Total GAD 7 Score 0  Anxiety Difficulty - Somewhat difficult Somewhat difficult -    Assessment/ Plan: 42 y.o. male   1. Depression with anxiety PHQ 9 with a score of 2, this is greatly decreased from his previous survey.  His gad 7 score 13, this is slightly increased from previous survey.  I have refilled his Lexapro for 1 year.  He will follow-up in 3 months with regards to depression/anxiety.  He continues to have sleep difficulty see below.  2. Sleep  difficulties Patient to start trazodone 25 to 50 mg nightly as needed sleep.  He will contact me within the next couple of weeks to let me know how he is doing on this medication.  If he is stable, plan  to see each other back in 3 months otherwise see each other in 4 to 6 weeks or sooner if needed.  At that point, would consider referral for evaluation for sleep apnea. STOP BANG score 2.  3. Chronic right shoulder pain Compared to March evaluation, patient has a loss of active range of motion secondary to pain.  On previous exam he had a positive empty can.  Chronic right shoulder pain was thought to be an acute on chronic exacerbation and did respond well to corticosteroid injection.  However, patient now with returning/worsening symptoms.  We discussed that orthopedic evaluation would most certainly be warranted given loss of active range of motion.  I do question a possible rotator cuff injury.  I offered oral NSAIDs but patient declined.  He notes that he will use over-the-counter ibuprofen or Aleve if needed, as he is able to tolerate pain at this time. - Ambulatory referral to Orthopedic Surgery   Meds ordered this encounter  Medications  . traZODone (DESYREL) 50 MG tablet    Sig: Take 0.5-1 tablets (25-50 mg total) by mouth at bedtime as needed for sleep.    Dispense:  30 tablet    Refill:  02  . escitalopram (LEXAPRO) 20 MG tablet    Sig: Take 1 tablet (20 mg total) by mouth daily.    Dispense:  90 tablet    Refill:  3     Timothy Ante Hulen Skains, DO [facility] [phone]

## 2017-12-12 NOTE — Assessment & Plan Note (Signed)
Compared to March evaluation, patient has a loss of active range of motion secondary to pain.  On previous exam he had a positive empty can.  Chronic right shoulder pain was thought to be an acute on chronic exacerbation and did respond well to corticosteroid injection.  However, patient now with returning/worsening symptoms.  We discussed that orthopedic evaluation would most certainly be warranted given loss of active range of motion.  I do question a possible rotator cuff injury.  I offered oral NSAIDs but patient declined.  He notes that he will use over-the-counter ibuprofen or Aleve if needed, as he is able to tolerate pain at this time. - Ambulatory referral to Orthopedic Surgery

## 2017-12-12 NOTE — Assessment & Plan Note (Signed)
PHQ 9 with a score of 2, this is greatly decreased from his previous survey.  His gad 7 score 13, this is slightly increased from previous survey.  I have refilled his Lexapro for 1 year.  He will follow-up in 3 months with regards to depression/anxiety.  He continues to have sleep difficulty see below.

## 2017-12-12 NOTE — Patient Instructions (Signed)
I have placed a referral to the orthopedist for further evaluation of your right shoulder.  I do suspect that you probably have a tear within the rotator cuff.  If you do not hear for an appointment by this time next week, please call her office.  I have refilled your Lexapro for 1 year.  I would like to see you in the next 3 months for follow-up on this.  I have also started you on trazodone for sleep.  Take 1/2 tablet 30 minutes before bedtime on a day that you do not have something to do the next morning.  You may increase this to 1 full tablet if you are not getting enough sleep from the half tablet.  Contact me within the next few weeks to let me know how this is going.  If you are stable on this regimen, we will plan to see each other in 3 months.     Rotator Cuff Injury Rotator cuff injury is any type of injury to the set of muscles and tendons that make up the stabilizing unit of your shoulder. This unit holds the ball of your upper arm bone (humerus) in the socket of your shoulder blade (scapula). What are the causes? Injuries to your rotator cuff most commonly come from sports or activities that cause your arm to be moved repeatedly over your head. Examples of this include throwing, weight lifting, swimming, or racquet sports. Long lasting (chronic) irritation of your rotator cuff can cause soreness and swelling (inflammation), bursitis, and eventual damage to your tendons, such as a tear (rupture). What are the signs or symptoms? Acute rotator cuff tear:  Sudden tearing sensation followed by severe pain shooting from your upper shoulder down your arm toward your elbow.  Decreased range of motion of your shoulder because of pain and muscle spasm.  Severe pain.  Inability to raise your arm out to the side because of pain and loss of muscle power (large tears).  Chronic rotator cuff tear:  Pain that usually is worse at night and may interfere with sleep.  Gradual weakness and  decreased shoulder motion as the pain worsens.  Decreased range of motion.  Rotator cuff tendinitis:  Deep ache in your shoulder and the outside upper arm over your shoulder.  Pain that comes on gradually and becomes worse when lifting your arm to the side or turning it inward.  How is this diagnosed? Rotator cuff injury is diagnosed through a medical history, physical exam, and imaging exam. The medical history helps determine the type of rotator cuff injury. Your health care provider will look at your injured shoulder, feel the injured area, and ask you to move your shoulder in different positions. X-ray exams typically are done to rule out other causes of shoulder pain, such as fractures. MRI is the exam of choice for the most severe shoulder injuries because the images show muscles and tendons. How is this treated? Chronic tear:  Medicine for pain, such as acetaminophen or ibuprofen.  Physical therapy and range-of-motion exercises may be helpful in maintaining shoulder function and strength.  Steroid injections into your shoulder joint.  Surgical repair of the rotator cuff if the injury does not heal with noninvasive treatment.  Acute tear:  Anti-inflammatory medicines such as ibuprofen and naproxen to help reduce pain and swelling.  A sling to help support your arm and rest your rotator cuff muscles. Long-term use of a sling is not advised. It may cause significant stiffening of the shoulder  joint.  Surgery may be considered within a few weeks, especially in younger, active people, to return the shoulder to full function.  Indications for surgical treatment include the following: ? Age younger than 60 years. ? Rotator cuff tears that are complete. ? Physical therapy, rest, and anti-inflammatory medicines have been used for 6-8 weeks, with no improvement. ? Employment or sporting activity that requires constant shoulder use.  Tendinitis:  Anti-inflammatory medicines such as  ibuprofen and naproxen to help reduce pain and swelling.  A sling to help support your arm and rest your rotator cuff muscles. Long-term use of a sling is not advised. It may cause significant stiffening of the shoulder joint.  Severe tendinitis may require: ? Steroid injections into your shoulder joint. ? Physical therapy. ? Surgery.  Follow these instructions at home:  Apply ice to your injury: ? Put ice in a plastic bag. ? Place a towel between your skin and the bag. ? Leave the ice on for 20 minutes, 2-3 times a day.  If you have a shoulder immobilizer (sling and straps), wear it until told otherwise by your health care provider.  You may want to sleep on several pillows or in a recliner at night to lessen swelling and pain.  Only take over-the-counter or prescription medicines for pain, discomfort, or fever as directed by your health care provider.  Do simple hand squeezing exercises with a soft rubber ball to decrease hand swelling. Contact a health care provider if:  Your shoulder pain increases, or new pain or numbness develops in your arm, hand, or fingers.  Your hand or fingers are colder than your other hand. Get help right away if:  Your arm, hand, or fingers are numb or tingling.  Your arm, hand, or fingers are increasingly swollen and painful, or they turn white or blue. This information is not intended to replace advice given to you by your health care provider. Make sure you discuss any questions you have with your health care provider. Document Released: 07/05/2000 Document Revised: 12/14/2015 Document Reviewed: 02/17/2013 Elsevier Interactive Patient Education  2018 [employer].

## 2017-12-24 ENCOUNTER — Ambulatory Visit (INDEPENDENT_AMBULATORY_CARE_PROVIDER_SITE_OTHER): Payer: Managed Care, Other (non HMO) | Admitting: Orthopaedic Surgery

## 2018-04-03 ENCOUNTER — Encounter: Payer: Managed Care, Other (non HMO) | Admitting: Family Medicine

## 2018-04-21 ENCOUNTER — Encounter: Payer: Self-pay | Admitting: Family Medicine

## 2018-04-22 ENCOUNTER — Encounter: Payer: Self-pay | Admitting: Family Medicine

## 2018-04-29 ENCOUNTER — Encounter: Payer: Self-pay | Admitting: Family Medicine

## 2018-04-29 ENCOUNTER — Ambulatory Visit (INDEPENDENT_AMBULATORY_CARE_PROVIDER_SITE_OTHER): Payer: BLUE CROSS/BLUE SHIELD | Admitting: Family Medicine

## 2018-04-29 VITALS — BP 128/80 | HR 76 | Temp 98.9°F | Ht 67.0 in | Wt 217.0 lb

## 2018-04-29 DIAGNOSIS — Z6833 Body mass index (BMI) 33.0-33.9, adult: Secondary | ICD-10-CM | POA: Insufficient documentation

## 2018-04-29 DIAGNOSIS — Z2821 Immunization not carried out because of patient refusal: Secondary | ICD-10-CM | POA: Diagnosis not present

## 2018-04-29 DIAGNOSIS — Z0000 Encounter for general adult medical examination without abnormal findings: Secondary | ICD-10-CM | POA: Diagnosis not present

## 2018-04-29 DIAGNOSIS — Z6837 Body mass index (BMI) 37.0-37.9, adult: Secondary | ICD-10-CM | POA: Insufficient documentation

## 2018-04-29 NOTE — Patient Instructions (Signed)

## 2018-04-29 NOTE — Progress Notes (Signed)
Subjective:    Patient ID: Timothy Torres, male    DOB: 1976/05/17, 42 y.o.   MRN: [id number]  Chief Complaint:  Annual Exam   HPI: Timothy Torres is a 42 y.o. male presenting on 04/29/2018 for Annual Exam  Pt presents today for annual physical exam. Pt states he is doing well overall. States he has stopped his trazodone and it sleeping well without it. States he stopped taking it at least 6 weeks ago. States he is able to fall asleep without trouble and sleep through the night. Pt states he has a new job and is enjoying it. States he walks the grounds, which are 14 acres, adding to his daily exercise. Pt states he has been watching his diet, eating less fried and greasy foods. He denies other concerns or complaints.   Relevant past medical, surgical, family, and social history reviewed and updated as indicated.  Allergies and medications reviewed and updated.   Past Medical History:  Diagnosis Date  . Anxiety   . Complication of anesthesia    hard time waking up, n and v  . No pertinent past medical history   . PONV (postoperative nausea and vomiting)     Past Surgical History:  Procedure Laterality Date  . APPENDECTOMY    . LAPAROSCOPIC APPENDECTOMY  04/15/2012   Procedure: APPENDECTOMY LAPAROSCOPIC;  Surgeon: Ardeth Sportsman, MD;  Location: [facility];  Service: General;  Laterality: N/A;  . WISDOM TOOTH EXTRACTION      Social History   Socioeconomic History  . Marital status: Married    Spouse name: Not on file  . Number of children: Not on file  . Years of education: Not on file  . Highest education level: Not on file  Occupational History  . Not on file  Social Needs  . Financial resource strain: Not on file  . Food insecurity:    Worry: Not on file    Inability: Not on file  . Transportation needs:    Medical: Not on file    Non-medical: Not on file  Tobacco Use  . Smoking status: Never Smoker  . Smokeless tobacco: Never Used  Substance and Sexual  Activity  . Alcohol use: Never    Frequency: Never  . Drug use: No  . Sexual activity: Not on file  Lifestyle  . Physical activity:    Days per week: Not on file    Minutes per session: Not on file  . Stress: Not on file  Relationships  . Social connections:    Talks on phone: Not on file    Gets together: Not on file    Attends religious service: Not on file    Active member of club or organization: Not on file    Attends meetings of clubs or organizations: Not on file    Relationship status: Not on file  . Intimate partner violence:    Fear of current or ex partner: Not on file    Emotionally abused: Not on file    Physically abused: Not on file    Forced sexual activity: Not on file  Other Topics Concern  . Not on file  Social History Narrative  . Not on file    Outpatient Encounter Medications as of 04/29/2018  Medication Sig  . escitalopram (LEXAPRO) 20 MG tablet Take 1 tablet (20 mg total) by mouth daily.  . [DISCONTINUED] traZODone (DESYREL) 50 MG tablet Take 0.5-1 tablets (25-50 mg total) by mouth at bedtime as  needed for sleep. (Patient not taking: Reported on 04/29/2018)   No facility-administered encounter medications on file as of 04/29/2018.     No Known Allergies  Review of Systems  Constitutional: Positive for activity change (increase in daily exercise). Negative for appetite change, chills, fatigue, fever and unexpected weight change.  HENT: Negative for congestion and dental problem.   Eyes: Negative for photophobia and visual disturbance.  Respiratory: Negative for cough, chest tightness, shortness of breath and wheezing.   Cardiovascular: Negative for chest pain, palpitations and leg swelling.  Gastrointestinal: Positive for nausea (intermittent). Negative for abdominal distention, abdominal pain, anal bleeding, blood in stool, constipation, diarrhea, rectal pain and vomiting.  Endocrine: Negative for cold intolerance, heat intolerance, polydipsia,  polyphagia and polyuria.  Genitourinary: Negative for decreased urine volume, difficulty urinating, discharge, dysuria, enuresis, flank pain, frequency, hematuria, penile pain, penile swelling, scrotal swelling, testicular pain and urgency.  Musculoskeletal: Negative for arthralgias, back pain, gait problem, joint swelling and myalgias.  Skin: Negative for rash and wound.  Neurological: Negative for dizziness, seizures, syncope, speech difficulty, weakness, numbness and headaches.  Psychiatric/Behavioral: The patient is not nervous/anxious.   All other systems reviewed and are negative.       Objective:    BP 128/80   Pulse 76   Temp 98.9 F (37.2 C) (Oral)   Ht 5\' 7"  (1.702 m)   Wt 217 lb (98.4 kg)   BMI 33.99 kg/m    Wt Readings from Last 3 Encounters:  04/29/18 217 lb (98.4 kg)  12/12/17 207 lb (93.9 kg)  10/31/17 204 lb (92.5 kg)    Physical Exam  Constitutional: He is oriented to person, place, and time. He appears well-developed and well-nourished. He is cooperative. No distress.  HENT:  Head: Normocephalic and atraumatic.  Right Ear: Hearing, tympanic membrane, external ear and ear canal normal.  Left Ear: Hearing, tympanic membrane, external ear and ear canal normal.  Nose: Nose normal.  Mouth/Throat: Uvula is midline, oropharynx is clear and moist and mucous membranes are normal.  Eyes: Pupils are equal, round, and reactive to light. Conjunctivae, EOM and lids are normal. Right eye exhibits no nystagmus. Left eye exhibits no nystagmus.  Neck: Trachea normal, normal range of motion, full passive range of motion without pain and phonation normal. Neck supple. Normal carotid pulses and no JVD present. Carotid bruit is not present. No thyroid mass and no thyromegaly present.  Cardiovascular: Normal rate, regular rhythm, S1 normal, S2 normal, normal heart sounds and intact distal pulses. Exam reveals no gallop and no friction rub.  No murmur heard. Pulmonary/Chest: Effort  normal and breath sounds normal. No respiratory distress.  Abdominal: Soft. Normal appearance, normal aorta and bowel sounds are normal. There is no hepatosplenomegaly. There is no tenderness. There is no CVA tenderness. No hernia.  Musculoskeletal: Normal range of motion. He exhibits no edema.  Lymphadenopathy:    He has no cervical adenopathy.  Neurological: He is alert and oriented to person, place, and time. He has normal strength and normal reflexes. He displays normal reflexes. No cranial nerve deficit or sensory deficit. He displays a negative Romberg sign. GCS eye subscore is 4. GCS verbal subscore is 5. GCS motor subscore is 6.  Skin: Skin is warm, dry and intact. Capillary refill takes less than 2 seconds.  Psychiatric: He has a normal mood and affect. His speech is normal and behavior is normal. Judgment and thought content normal. His mood appears not anxious. Cognition and memory are normal. He expresses  no homicidal and no suicidal ideation. He expresses no suicidal plans and no homicidal plans.  Nursing note and vitals reviewed.        Pertinent labs & imaging results that were available during my care of the patient were reviewed by me and considered in my medical decision making.  Assessment & Plan:  Jerrel was seen today for annual exam.  Diagnoses and all orders for this visit:   Routine general medical examination at a health care facility Health maintenance discussed. Pt declined influenza vaccination.  -     CBC with Differential -     Comprehensive metabolic panel -     Lipid panel -     TSH   BMI 33.0-33.9,adult Diet and exercise discussed and encouraged. Increase water intake. -     CBC with Differential -     Comprehensive metabolic panel -     Lipid panel -     TSH  Continue all other maintenance medications.  Follow up plan: Return in 6 months (on 10/29/2018).  Educational handout given for health maintenance.   The above assessment and management  plan was discussed with the patient. The patient verbalized understanding of and has agreed to the management plan. Patient is aware to call the clinic if symptoms persist or worsen. Patient is aware when to return to the clinic for a follow-up visit. Patient educated on when it is appropriate to go to the emergency department.   Kari Baars, FNP-C [facility] [phone]

## 2018-04-30 ENCOUNTER — Encounter: Payer: Self-pay | Admitting: Family Medicine

## 2018-04-30 DIAGNOSIS — E782 Mixed hyperlipidemia: Secondary | ICD-10-CM | POA: Insufficient documentation

## 2018-04-30 LAB — CBC WITH DIFFERENTIAL/PLATELET
BASOS ABS: 0.1 10*3/uL (ref 0.0–0.2)
Basos: 1 %
EOS (ABSOLUTE): 0.1 10*3/uL (ref 0.0–0.4)
Eos: 1 %
Hematocrit: 44.8 % (ref 37.5–51.0)
Hemoglobin: 15.4 g/dL (ref 13.0–17.7)
Immature Grans (Abs): 0.1 10*3/uL (ref 0.0–0.1)
Immature Granulocytes: 1 %
LYMPHS ABS: 2.1 10*3/uL (ref 0.7–3.1)
Lymphs: 32 %
MCH: 31.5 pg (ref 26.6–33.0)
MCHC: 34.4 g/dL (ref 31.5–35.7)
MCV: 92 fL (ref 79–97)
MONOS ABS: 0.5 10*3/uL (ref 0.1–0.9)
Monocytes: 7 %
NEUTROS ABS: 3.9 10*3/uL (ref 1.4–7.0)
Neutrophils: 58 %
PLATELETS: 273 10*3/uL (ref 150–450)
RBC: 4.89 x10E6/uL (ref 4.14–5.80)
RDW: 13 % (ref 12.3–15.4)
WBC: 6.7 10*3/uL (ref 3.4–10.8)

## 2018-04-30 LAB — LIPID PANEL
CHOL/HDL RATIO: 6.1 ratio — AB (ref 0.0–5.0)
Cholesterol, Total: 245 mg/dL — ABNORMAL HIGH (ref 100–199)
HDL: 40 mg/dL (ref >39)
LDL Calculated: 126 mg/dL — ABNORMAL HIGH (ref 0–99)
Triglycerides: 396 mg/dL — ABNORMAL HIGH (ref 0–149)
VLDL Cholesterol Cal: 79 mg/dL — ABNORMAL HIGH (ref 5–40)

## 2018-04-30 LAB — COMPREHENSIVE METABOLIC PANEL
ALBUMIN: 4.9 g/dL (ref 3.5–5.5)
ALK PHOS: 54 IU/L (ref 39–117)
ALT: 31 IU/L (ref 0–44)
AST: 23 IU/L (ref 0–40)
Albumin/Globulin Ratio: 2 (ref 1.2–2.2)
BILIRUBIN TOTAL: 0.4 mg/dL (ref 0.0–1.2)
BUN / CREAT RATIO: 9 (ref 9–20)
BUN: 11 mg/dL (ref 6–24)
CHLORIDE: 101 mmol/L (ref 96–106)
CO2: 23 mmol/L (ref 20–29)
Calcium: 9.9 mg/dL (ref 8.7–10.2)
Creatinine, Ser: 1.16 mg/dL (ref 0.76–1.27)
GFR calc Af Amer: 91 mL/min/{1.73_m2} (ref >59)
GFR calc non Af Amer: 78 mL/min/{1.73_m2} (ref >59)
GLUCOSE: 88 mg/dL (ref 65–99)
Globulin, Total: 2.4 g/dL (ref 1.5–4.5)
Potassium: 4.8 mmol/L (ref 3.5–5.2)
Sodium: 141 mmol/L (ref 134–144)
Total Protein: 7.3 g/dL (ref 6.0–8.5)

## 2018-04-30 LAB — TSH: TSH: 3.45 u[IU]/mL (ref 0.450–4.500)

## 2018-06-25 DIAGNOSIS — M79671 Pain in right foot: Secondary | ICD-10-CM | POA: Diagnosis not present

## 2018-06-25 DIAGNOSIS — M722 Plantar fascial fibromatosis: Secondary | ICD-10-CM | POA: Diagnosis not present

## 2018-09-07 DIAGNOSIS — J101 Influenza due to other identified influenza virus with other respiratory manifestations: Secondary | ICD-10-CM | POA: Diagnosis not present

## 2018-09-07 DIAGNOSIS — R6889 Other general symptoms and signs: Secondary | ICD-10-CM | POA: Diagnosis not present

## 2018-09-07 DIAGNOSIS — Z6834 Body mass index (BMI) 34.0-34.9, adult: Secondary | ICD-10-CM | POA: Diagnosis not present

## 2019-01-08 ENCOUNTER — Encounter: Payer: BLUE CROSS/BLUE SHIELD | Admitting: Family Medicine

## 2019-02-02 ENCOUNTER — Other Ambulatory Visit: Payer: Self-pay | Admitting: Family Medicine

## 2019-02-02 MED ORDER — ESCITALOPRAM OXALATE 20 MG PO TABS: 20.0000 mg | ORAL_TABLET | Freq: Every day | ORAL | 0 refills | Status: DC

## 2019-02-02 NOTE — Telephone Encounter (Signed)
appt made and refill sent to last 

## 2019-02-02 NOTE — Telephone Encounter (Signed)
Gottschalk. NTBS LOV 12/12/17 for Dx

## 2019-02-02 NOTE — Addendum Note (Signed)
Addended by: Zannie Cove on: 02/02/2019 02:45 PM   Modules accepted: Orders

## 2019-04-29 ENCOUNTER — Other Ambulatory Visit: Payer: Self-pay

## 2019-04-30 ENCOUNTER — Ambulatory Visit (INDEPENDENT_AMBULATORY_CARE_PROVIDER_SITE_OTHER): Payer: Self-pay | Admitting: Family Medicine

## 2019-04-30 ENCOUNTER — Encounter: Payer: Self-pay | Admitting: Family Medicine

## 2019-04-30 ENCOUNTER — Other Ambulatory Visit: Payer: Self-pay

## 2019-04-30 VITALS — BP 133/89 | HR 75 | Temp 97.0°F | Ht 67.0 in | Wt 226.0 lb

## 2019-04-30 DIAGNOSIS — K921 Melena: Secondary | ICD-10-CM

## 2019-04-30 DIAGNOSIS — R1013 Epigastric pain: Secondary | ICD-10-CM

## 2019-04-30 DIAGNOSIS — E782 Mixed hyperlipidemia: Secondary | ICD-10-CM

## 2019-04-30 DIAGNOSIS — K529 Noninfective gastroenteritis and colitis, unspecified: Secondary | ICD-10-CM

## 2019-04-30 DIAGNOSIS — F418 Other specified anxiety disorders: Secondary | ICD-10-CM

## 2019-04-30 DIAGNOSIS — R1310 Dysphagia, unspecified: Secondary | ICD-10-CM

## 2019-04-30 MED ORDER — ESCITALOPRAM OXALATE 20 MG PO TABS: 20.0000 mg | ORAL_TABLET | Freq: Every day | ORAL | 3 refills | Status: DC

## 2019-04-30 MED ORDER — PANTOPRAZOLE SODIUM 20 MG PO TBEC: 20.0000 mg | DELAYED_RELEASE_TABLET | Freq: Every day | ORAL | 1 refills | Status: DC

## 2019-04-30 NOTE — Progress Notes (Signed)
Subjective: CC: follow up Depression/ Anxiety PCP: Janora Norlander, DO UDJ:SHFWYO Granlund is a 43 y.o. male presenting to clinic today for:  1. Depression/ Anxiety/ Sleep difficulty Patient reports that he is sleeping extremely well.  He did not need to continue the trazodone.  He has been taking the Lexapro 20 mg daily with good control of mood and irritability.  He notes that he actually forgot his medicine and went a couple of days without it and noticed huge worsening of his mood.  He has since resumed medication and wishes to continue.  2. Diarrhea/ Dysphagia He does report chronic diarrhea and has had some blood in stool.  Last episode was a few weeks ago.  He had an episode recently where he ate a piece of broccoli and it felt like it got stuck in his throat.  He was unable to get it to go down for a while despite drinking fluids.  He is had this occur on a couple of occasions over the last few weeks and is wondering what could be causing it.  Medical history is significant for GERD as a child but he has not been on medicine for quite some time.  Denies abdominal pain, nausea, vomiting.  He has gained weight since last year.  He never pursued lifestyle modification as recommended after being found to have elevated cholesterol.  ROS: Per HPI  No Known Allergies Past Medical History:  Diagnosis Date  . Anxiety   . Complication of anesthesia    hard time waking up, n and v  . No pertinent past medical history   . PONV (postoperative nausea and vomiting)     Current Outpatient Medications:  .  escitalopram (LEXAPRO) 20 MG tablet, Take 1 tablet (20 mg total) by mouth daily., Disp: 90 tablet, Rfl: 0 Social History   Socioeconomic History  . Marital status: Married    Spouse name: Not on file  . Number of children: Not on file  . Years of education: Not on file  . Highest education level: Not on file  Occupational History  . Not on file  Social Needs  . Financial  resource strain: Not on file  . Food insecurity    Worry: Not on file    Inability: Not on file  . Transportation needs    Medical: Not on file    Non-medical: Not on file  Tobacco Use  . Smoking status: Never Smoker  . Smokeless tobacco: Never Used  Substance and Sexual Activity  . Alcohol use: Never    Frequency: Never  . Drug use: No  . Sexual activity: Not on file  Lifestyle  . Physical activity    Days per week: Not on file    Minutes per session: Not on file  . Stress: Not on file  Relationships  . Social Herbalist on phone: Not on file    Gets together: Not on file    Attends religious service: Not on file    Active member of club or organization: Not on file    Attends meetings of clubs or organizations: Not on file    Relationship status: Not on file  . Intimate partner violence    Fear of current or ex partner: Not on file    Emotionally abused: Not on file    Physically abused: Not on file    Forced sexual activity: Not on file  Other Topics Concern  . Not on file  Social  History Narrative  . Not on file   Family History  Problem Relation Age of Onset  . Hypertension Mother   . Thyroid disease Mother   . Depression Mother   . Hypertension Father   . Thyroid disease Father   . Heart disease Maternal Grandfather   . Stroke Paternal Grandfather   . Depression Sister   . Cancer Neg Hx   . Diabetes Neg Hx     Objective: Office vital signs reviewed. BP 133/89   Pulse 75   Temp (!) 97 F (36.1 C) (Temporal)   Ht 5' 7"  (1.702 m)   Wt 226 lb (102.5 kg)   SpO2 97%   BMI 35.40 kg/m   Physical Examination:  General: Awake, alert, well nourished, No acute distress Cardio: Regular rate and rhythm.  S1-S2 heard.  No murmurs. Pulm: Clear to auscultation bilaterally.  Normal work of breathing on room air. GI: Soft,+ epigastric tenderness.  No palpable masses.  Bowel sounds present x4. Psych: Mood stable, speech normal, affect appropriate, good  eye contact, pleasant.  Depression screen [facility] 2/9 04/30/2019 04/29/2018 12/12/2017  Decreased Interest 0 0 1  Down, Depressed, Hopeless 0 0 1  PHQ - 2 Score 0 0 2  Altered sleeping - - 0  Tired, decreased energy - - 0  Change in appetite - - 0  Feeling bad or failure about yourself  - - 0  Trouble concentrating - - 0  Moving slowly or fidgety/restless - - 0  Suicidal thoughts - - 0  PHQ-9 Score - - 2  Difficult doing work/chores - - Somewhat difficult   GAD 7 : Generalized Anxiety Score 04/30/2019 12/12/2017 10/31/2017 10/10/2017  Nervous, Anxious, on Edge 0 1 1 3   Control/stop worrying 0 1 2 3   Worry too much - different things 1 3 2 3   Trouble relaxing 1 3 2 3   Restless 0 2 1 0  Easily annoyed or irritable 1 3 1 2   Afraid - awful might happen 0 0 1 1  Total GAD 7 Score 3 13 10 15   Anxiety Difficulty Somewhat difficult - Somewhat difficult Somewhat difficult    Assessment/ Plan: 43 y.o. male   1. Depression with anxiety Mood is under good control with Lexapro.  This is been renewed.  2. Mixed hyperlipidemia Patient will return for fasting lipid panel - CMP14+EGFR; Future - Lipid Panel; Future  3. Epigastric abdominal pain Likely reflective of gastritis.  I have placed him on a PPI and also place referral to gastroenterology given reports of hematochezia, chronic diarrhea and dysphagia - pantoprazole (PROTONIX) 20 MG tablet; Take 1 tablet (20 mg total) by mouth daily.  Dispense: 30 tablet; Refill: 1 - Ambulatory referral to Gastroenterology - CBC; Future  4. Dysphagia, unspecified type Possibly reflective of uncontrolled GERD versus stricture.  His family has a history of stricture requiring esophageal stretch previously - pantoprazole (PROTONIX) 20 MG tablet; Take 1 tablet (20 mg total) by mouth daily.  Dispense: 30 tablet; Refill: 1 - Ambulatory referral to Gastroenterology  5. Hematochezia Question mucosal irritation from chronic diarrhea versus hemorrhoid versus  malignancy. - Ambulatory referral to Gastroenterology - CBC; Future  6. Chronic diarrhea ?  IBS-D. - Ambulatory referral to Gastroenterology - TSH; Future   No orders of the defined types were placed in this encounter.    Janora Norlander, DO [place] [phone]

## 2019-04-30 NOTE — Patient Instructions (Signed)
Trial of protonix for your stomach.  Referral to the GI doctor in.  Call me if no call for appointment in 1 week.  Come in for fasting labs.

## 2019-05-04 ENCOUNTER — Encounter: Payer: Self-pay | Admitting: Gastroenterology

## 2019-05-15 ENCOUNTER — Encounter: Payer: Self-pay | Admitting: Gastroenterology

## 2019-05-15 NOTE — Progress Notes (Signed)
Referring Provider: Janora Norlander, DO Primary Care Physician:  Janora Norlander, DO Primary Gastroenterologist:  Dr. Oneida Alar  Chief Complaint  Patient presents with   Nausea    all the time regardless what he eats or dont   Abdominal Pain    epigastric pain x years   Diarrhea    daily depending on what he eats. greasy foods causes it to be all day long   Rectal Bleeding    with eating red meats but has been a while since this has happened    HPI:   Timothy Torres is a 43 y.o. male presenting today at the request of Gottschalk, Ashly M, DO for epigastric pain, dysphagia, hematochezia, and diarrhea.  Reviewed patient's last office note with primary care on 04/30/2019.  Patient reported chronic diarrhea with some blood in his stool.  Last episode about a week ago.  Also reporting intermittent dysphagia over the last few weeks.  Specifically reports an episode of broccoli getting stuck in his throat.  History of GERD as a child but not on any chronic medications.  Epigastric tenderness noted on exam.  Patient was started on Protonix 20 mg daily and referred to GI.  Plans for labs in the future including CMP, CBC, and TSH.  Last labs in our system on 04/29/2018 with CBC, CMP, and TSH normal.   Today:  Mid left abdominal pain. Feels sore. Like he has been hit hard. Present for at least a year. As long as he isn't pressing on it, he doesn't have pain. Not triggered by foods. Severity 5/10. States it is dull. Reports he used to have heartburn all the time. Hasn't had any for a while. Over the last couple of weeks, food has gotten hung in his chest. States, "It will push itself back up." Occurred with broccoli and chicken wings. Reports acid reflux at night. Has woken him up in the middle of the night with acid coming back up in his throat. Does eat within 3 hours of laying down. Doesn't have reflux symptoms during the day. Occurred once every couple of weeks. States he eats anything.  Has laid off greasy foods because it causes diarrhea. Reports he stays sick on his stomach. Present intermittently daily. Vomiting about once a week. No hematemesis. Nausea present for the last 6 months. No identified food triggers. Vomiting is random.   Diarrhea. If stools in the middle of the night, stools will be watery. Initially greasy foods were causing a lot of trouble. Now also with [place], grilled cheese, and salad without dressing. Stools are green. Typically, 1 BM daily. Soft and mushy. Even with diarrhea, will only have 1 BM a day. Will wake up in the middle of the night about once a week. Will have stomach cramps in the middle of the night. Drinks milk. Eats cheese. Diarrhea has been present for over 1 year. No worsening of symptoms with breads or pastas. Still has his gallbladder.   Admits to blood in the stool. Usually associated with red meat consumption. Last occurred 2 weeks ago. For a few months, it was about daily. This has slowed down. Blood on stools and toilet tissue.  No known hemorrhoids.  Has decreased red meat consumption. No prior colonoscopy. No family history of colon cancer. No unintentional weight loss. Even with nausea, still able to eat normally. No known hemorrhoids.   Taking pepto to help with nausea.  Does not want to try Zofran. Was taking ibuprofen daily about 1  year ago. Now about once every other week. 800 mg.    Past Medical History:  Diagnosis Date   Anxiety    Complication of anesthesia    hard time waking up, n and v   PONV (postoperative nausea and vomiting)     Past Surgical History:  Procedure Laterality Date   APPENDECTOMY     LAPAROSCOPIC APPENDECTOMY  04/15/2012   Procedure: APPENDECTOMY LAPAROSCOPIC;  Surgeon: Ardeth SportsmanSteven C. Gross, MD;  Location: [facility];  Service: General;  Laterality: N/A;   WISDOM TOOTH EXTRACTION      Current Outpatient Medications  Medication Sig Dispense Refill   bismuth subsalicylate (PEPTO BISMOL) 262 MG  chewable tablet Chew 524 mg by mouth as needed.     escitalopram (LEXAPRO) 20 MG tablet Take 1 tablet (20 mg total) by mouth daily. 90 tablet 3   polyethylene glycol-electrolytes (NULYTELY/GOLYTELY) 420 g solution Take 4,000 mLs by mouth once for 1 dose. 4000 mL 0   No current facility-administered medications for this visit.     Allergies as of 05/17/2019   (No Known Allergies)    Family History  Problem Relation Age of Onset   Hypertension Mother    Thyroid disease Mother    Depression Mother    Hypertension Father    Thyroid disease Father    Heart disease Maternal Grandfather    Stroke Paternal Grandfather    Depression Sister    Cancer Neg Hx    Diabetes Neg Hx    Colon cancer Neg Hx     Social History   Socioeconomic History   Marital status: Married    Spouse name: Not on file   Number of children: Not on file   Years of education: Not on file   Highest education level: Not on file  Occupational History   Not on file  Social Needs   Financial resource strain: Not on file   Food insecurity    Worry: Not on file    Inability: Not on file   Transportation needs    Medical: Not on file    Non-medical: Not on file  Tobacco Use   Smoking status: Never Smoker   Smokeless tobacco: Never Used  Substance and Sexual Activity   Alcohol use: Not Currently    Frequency: Never   Drug use: No   Sexual activity: Not on file  Lifestyle   Physical activity    Days per week: Not on file    Minutes per session: Not on file   Stress: Not on file  Relationships   Social connections    Talks on phone: Not on file    Gets together: Not on file    Attends religious service: Not on file    Active member of club or organization: Not on file    Attends meetings of clubs or organizations: Not on file    Relationship status: Not on file   Intimate partner violence    Fear of current or ex partner: Not on file    Emotionally abused: Not on file     Physically abused: Not on file    Forced sexual activity: Not on file  Other Topics Concern   Not on file  Social History Narrative   Not on file    Review of Systems: Gen: Denies any fever, chills. If he laughs hard, he feels like he may pass out. Has never passed out.  CV: Denies chest pain, heart palpitations Resp: Denies shortness of breath at  rest.  Reports he is out of shape and may have some shortness of breath with exertion.  Denies cough. GI: See HPI GU : Denies urinary burning, urinary frequency, urinary hesitancy MS: Denies joint pain Derm: Denies rash Psych: Denies depression.admits to anxiety which is well controlled with medications.   Heme: See HPI  Physical Exam: BP 136/84    Pulse 77    Temp (!) 97 F (36.1 C) (Oral)    Ht 5\' 7"  (1.702 m)    Wt 227 lb 6.4 oz (103.1 kg)    BMI 35.62 kg/m  General:   Alert and oriented. Pleasant and cooperative. Well-nourished and well-developed.  Head:  Normocephalic and atraumatic. Eyes:  Without icterus, sclera clear and conjunctiva pink.  Ears:  Normal auditory acuity. Nose:  No deformity, discharge,  or lesions. Lungs:  Clear to auscultation bilaterally. No wheezes, rales, or rhonchi. No distress.  Heart:  S1, S2 present without murmurs appreciated.  Abdomen:  +BS, soft, and non-distended.  Mild point tenderness to the left of the umbilicus.  Otherwise no significant tenderness to palpation.  No HSM noted. No guarding or rebound. No masses appreciated.  Rectal:  Deferred  Msk:  Symmetrical without gross deformities. Normal posture. Extremities:  Without edema. Neurologic:  Alert and  oriented x4;  grossly normal neurologically. Skin:  Intact without significant lesions or rashes. Psych: Normal mood and affect.

## 2019-05-17 ENCOUNTER — Encounter: Payer: Self-pay | Admitting: Gastroenterology

## 2019-05-17 ENCOUNTER — Other Ambulatory Visit: Payer: Self-pay | Admitting: *Deleted

## 2019-05-17 ENCOUNTER — Telehealth: Payer: Self-pay | Admitting: *Deleted

## 2019-05-17 ENCOUNTER — Encounter: Payer: Self-pay | Admitting: *Deleted

## 2019-05-17 ENCOUNTER — Other Ambulatory Visit: Payer: Self-pay

## 2019-05-17 ENCOUNTER — Ambulatory Visit (INDEPENDENT_AMBULATORY_CARE_PROVIDER_SITE_OTHER): Payer: Managed Care, Other (non HMO) | Admitting: Gastroenterology

## 2019-05-17 VITALS — BP 136/84 | HR 77 | Temp 97.0°F | Ht 67.0 in | Wt 227.4 lb

## 2019-05-17 DIAGNOSIS — R109 Unspecified abdominal pain: Secondary | ICD-10-CM

## 2019-05-17 DIAGNOSIS — R112 Nausea with vomiting, unspecified: Secondary | ICD-10-CM

## 2019-05-17 DIAGNOSIS — R197 Diarrhea, unspecified: Secondary | ICD-10-CM | POA: Diagnosis not present

## 2019-05-17 DIAGNOSIS — K625 Hemorrhage of anus and rectum: Secondary | ICD-10-CM | POA: Diagnosis not present

## 2019-05-17 DIAGNOSIS — R1310 Dysphagia, unspecified: Secondary | ICD-10-CM | POA: Diagnosis not present

## 2019-05-17 DIAGNOSIS — K219 Gastro-esophageal reflux disease without esophagitis: Secondary | ICD-10-CM | POA: Insufficient documentation

## 2019-05-17 MED ORDER — PEG 3350-KCL-NA BICARB-NACL 420 G PO SOLR: 4000.0000 mL | Freq: Once | ORAL | 0 refills | Status: AC

## 2019-05-17 NOTE — Telephone Encounter (Signed)
[place] scheduled for 10/29 at 8:30am, arrival time 8:15am, npo midnight  Called pt and is aware of appt details. He voiced understanding.  TCS/EGD +/-dil with [facility] is scheduled for 08/13/2019 at 2:00pm. Patient aware of arrival time, aware will need to go for COVID-19 testing 2 days prior. Patient aware will mail instructions. Rx sent to pharmacy.

## 2019-05-17 NOTE — Assessment & Plan Note (Signed)
43 y.o. male with past medical history of anxiety reporting intermittent bright red blood per rectum for the last several months.  Initially it was daily and seemed to be more associated with consumption of red meats; however, he has decreased red meat consumption and last occurrence of BRBPR was about 2 weeks ago. Also with intermittent diarrhea over the last year.  Typically 1 BM daily, usually soft and mushy.  Usually no associated abdominal pain.  Will wake up in the middle of the night about once a week with abdominal cramping followed by a single watery bowel movement. Initially greasy foods were causing diarrhea; however, looser stools are also now occurring with [place], grilled cheese, and salads.  Also eating dairy regularly.  Denies worsening symptoms with breads or pastas.  No prior colonoscopy.  No family history of colon cancer.  No unintentional weight loss.  Rectal bleeding could be secondary to internal hemorrhoids, colon polyps, IBD, or malignancy. Diarrhea may also be secondary to dietary intolerance or IBS-D.  Patient needs further evaluation with TCS with Dr. Oneida Alar in the near future.  The risks, benefits, and alternatives have been discussed in detail with patient. They have stated understanding and desire to proceed.   Patient has labs he is to complete with PCP including CMP, CBC, TSH, and lipid panel.  I am adding IgA total and TTG IgA to evaluate for celiac. Follow-up after colonoscopy.

## 2019-05-17 NOTE — Assessment & Plan Note (Addendum)
Patient reporting intermittent diarrhea over the last year.  Typically 1 BM daily, usually soft and mushy.  Typically no associated abdominal pain.  Will wake up in the middle of the night about once a week with abdominal cramping followed by a single watery bowel movement. Initially greasy foods were causing diarrhea; however, looser stools are also now occurring with [place], grilled cheese, and salads.  Also eating dairy regularly.  Denies worsening symptoms with breads or pastas.  Admits to intermittent bright red blood per rectum for the last several months.  Initially it was daily and seemed to be more associated with consumption of red meats; however, he has decreased red meat consumption and last occurrence of BRBPR was about 2 weeks ago.  No prior colonoscopy.  No family history of colon cancer.  No unintentional weight loss.    I suspect patient's loose stools and occasional abdominal cramping are associated with dietary intolerances.  Possible IBS-D.  With bright red blood per rectum, cannot rule out underlying IBD.  Rectal bleeding may also be secondary to internal hemorrhoids or colon polyps, less likely malignancy. Check celiac studies. Patient is also having labs drawn by PCP including CBC, CMP, TSH, and lipid panel. States he will have celiac labs drawn with his labs from his PCP.   Advised patient to follow a dairy free diet for the next 2 weeks.  Requested patient to call in 2 weeks with a progress report. Proceed with TCS with Dr. Oneida Alar in the near future. The risks, benefits, and alternatives have been discussed in detail with patient. They have stated understanding and desire to proceed.  Follow-up after procedure.

## 2019-05-17 NOTE — Assessment & Plan Note (Addendum)
43 year old male with past medical history of anxiety he reports nocturnal reflux symptoms, dysphagia, and daily nausea with vomiting about once a week.  Reflux symptoms occur at night night with acid coming back up in his throat about once every couple of weeks.  Denies daytime symptoms.  Not on any antacid medications.  Denies heartburn.  Dysphagia is new over the past 2 weeks with broccoli and chicken wings hanging in his chest.  States, "it will push itself back up."  Nausea is daily for the last 6 months with random vomiting about once a week.  No identified triggers.  No significant abdominal pain.  Reports point tenderness in the mid left side of his abdomen that does not seem related.  He does have his gallbladder.  Has decreased fatty foods as a cause diarrhea.  Otherwise, states he eats pretty much anything. No unintentional weight loss.  Admits to eating within 3 hours of laying down.  Denies melena.  Admits to bright red blood per rectum as described below.  History of daily 800 mg ibuprofen use about 1 year ago.  Currently taking ibuprofen 800 mg about once every other week.  I suspect GERD symptoms are likely dietary and lifestyle related. Body habitus also likely influencing symptoms. Dysphagia likely secondary to uncontrolled nocturnal GERD.  Suspect nausea/vomiting could be secondary to possible gastritis.  Symptoms are not consistent with biliary etiology; however, will obtain ultrasound to rule this out.  This will also help evaluate left-sided abdominal point tenderness. Offered Zofran for nausea but patient declined. Start Protonix 40 mg daily 30 minutes before breakfast.  Discussed GERD diet and lifestyle. See AVS. Handout provided.  Patient has labs to complete with PCP including CBC, CMP, TSH, and lipid panel.  I have also added celiac labs today which he is going to complete with his PCP.  We will follow-up on all labs as they become available. Proceed with EGD with possible dilation  of his esophagus for dysphagia.  This will also help evaluate any underlying cause of nausea such as gastritis. The risks, benefits, and alternatives have been discussed in detail with patient. They have stated understanding and desire to proceed.  Follow-up after procedure.

## 2019-05-17 NOTE — Patient Instructions (Addendum)
Please have labs and [place] completed.   We will get you scheduled for upper endoscopy with possible dilation of your esophagus and colonoscopy in the near future with Dr. Darrick Penna.   Start Protonix 40 mg daily.   Follow GERD diet. Avoid spicy, fatty, greasy, and citrus foods. Avoid chocolate, caffeine, and carbonated beverages. Do not eat within 3 hours of laying down . Prop head of bed up on bricks or wood to create 6 inch incline. See GERD handout below.   I feel your diarrhea may be due to a dietary intolerance. Please follow lactose free diet for the next 2 weeks and call and let me know how you are doing. See handout below. If you are going out and may inadvertently consume dairy, please take lactaid pills.   We will plan to see you back after your procedures.   Ermalinda Memos, PA-C [facility]     Food Choices for Gastroesophageal Reflux Disease, Adult When you have gastroesophageal reflux disease (GERD), the foods you eat and your eating habits are very important. Choosing the right foods can help ease your discomfort. Think about working with a nutrition specialist (dietitian) to help you make good choices. What are tips for following this plan?  Meals  Choose healthy foods that are low in fat, such as fruits, vegetables, whole grains, low-fat dairy products, and lean meat, fish, and poultry.  Eat small meals often instead of 3 large meals a day. Eat your meals slowly, and in a place where you are relaxed. Avoid bending over or lying down until 2-3 hours after eating.  Avoid eating meals 2-3 hours before bed.  Avoid drinking a lot of liquid with meals.  Cook foods using methods other than frying. Bake, grill, or broil food instead.  Avoid or limit: ? Chocolate. ? Peppermint or spearmint. ? Alcohol. ? Pepper. ? Black and decaffeinated coffee. ? Black and decaffeinated tea. ? Bubbly (carbonated) soft drinks. ? Caffeinated energy drinks and soft  drinks.  Limit high-fat foods such as: ? Fatty meat or fried foods. ? Whole milk, cream, butter, or ice cream. ? Nuts and nut butters. ? Pastries, donuts, and sweets made with butter or shortening.  Avoid foods that cause symptoms. These foods may be different for everyone. Common foods that cause symptoms include: ? Tomatoes. ? Oranges, lemons, and limes. ? Peppers. ? Spicy food. ? Onions and garlic. ? Vinegar. Lifestyle  Maintain a healthy weight. Ask your doctor what weight is healthy for you. If you need to lose weight, work with your doctor to do so safely.  Exercise for at least 30 minutes for 5 or more days each week, or as told by your doctor.  Wear loose-fitting clothes.  Do not smoke. If you need help quitting, ask your doctor.  Sleep with the head of your bed higher than your feet. Use a wedge under the mattress or blocks under the bed frame to raise the head of the bed. Summary  When you have gastroesophageal reflux disease (GERD), food and lifestyle choices are very important in easing your symptoms.  Eat small meals often instead of 3 large meals a day. Eat your meals slowly, and in a place where you are relaxed.  Limit high-fat foods such as fatty meat or fried foods.  Avoid bending over or lying down until 2-3 hours after eating.  Avoid peppermint and spearmint, caffeine, alcohol, and chocolate. This information is not intended to replace advice given to you by your health care  provider. Make sure you discuss any questions you have with your health care provider. Document Released: 01/07/2012 Document Revised: 10/29/2018 Document Reviewed: 08/13/2016 Elsevier Patient Education  2020 [employer].

## 2019-05-17 NOTE — Assessment & Plan Note (Addendum)
Present for the last 6 months.  Nausea is intermittent daily.  Vomiting random about once a week.  No identified triggers.  Denies hematemesis.  Has decreased greasy foods as this causes diarrhea.  Otherwise states he eats pretty much anything.  Other upper GI symptoms include nocturnal GERD and dysphagia.  No significant upper abdominal pain reports left-sided abdominal point tenderness does not seem related.  History of daily 800 mg ibuprofen about 1 year ago.  Now taking 800 mg ibuprofen once every other week.  Denies melena or unintentional weight loss.  Admits to intermittent BRBPR as described below.   Suspect nausea/vomiting could be secondary to possible gastritis.  Symptoms are not consistent with biliary etiology; however, will obtain ultrasound to rule this out.  This will also help evaluate left-sided abdominal point tenderness. Offered Zofran for nausea but patient declined. Start Protonix 40 mg daily 30 minutes before breakfast.  Discussed GERD diet and lifestyle. See AVS. Handout provided.  Patient has labs to complete with PCP including CBC, CMP, TSH, and lipid panel.  I have also added celiac labs today which he is going to complete with his PCP.  We will follow-up on all labs as they become available. EGD with possible dilation of his esophagus for dysphagia.  This will also help evaluate any underlying cause of nausea such as gastritis. The risks, benefits, and alternatives have been discussed in detail with patient. They have stated understanding and desire to proceed.  Follow-up after procedure.

## 2019-05-17 NOTE — Assessment & Plan Note (Signed)
Dysphagia started in the last 2 weeks with broccoli and chicken handing in patients chest. Nocturnal GERD symptoms present as well as daily nausea with vomiting about once a week addressed below under GERD.  Not on any antacid medications.  Suspect dysphagia likely secondary to uncontrolled nocturnal GERD.  Possible esophageal web, ring, or stricture.  Doubt malignancy.  Proceed with EGD with possible esophageal dilation with Dr. Oneida Alar in the near future. The risks, benefits, and alternatives have been discussed in detail with patient. They have stated understanding and desire to proceed.  Start Protonix 40 mg daily. Discussed GERD diet and lifestyle.  See AVS.  GERD handout provided. Follow-up after procedure.

## 2019-05-17 NOTE — Assessment & Plan Note (Signed)
Patient reporting point tenderness to the left of his umbilicus. Has been present for at least a year.  Pain is only present when pressing on the area.  No food or other identified triggers.  Pain is dull about 5/10 in severity when pressing on the area.  Patient does have other GI symptoms including intermittent daily nausea for the last 6 months with vomiting about once a week, nocturnal GERD symptoms, and dysphagia.  However, his left-sided abdominal pain does not seem related to these other symptoms. Exam with tenderness to palpation just left of the umbilicus, is unremarkable.  I am pursuing an ultrasound to evaluate for ongoing nausea to ensure no gallbladder etiology.  Will order complete abdominal ultrasound to evaluate for any possible hernia cause of patient's left-sided point tenderness.

## 2019-05-20 ENCOUNTER — Ambulatory Visit (HOSPITAL_COMMUNITY): Payer: Managed Care, Other (non HMO)

## 2019-05-31 ENCOUNTER — Telehealth: Payer: Self-pay | Admitting: Gastroenterology

## 2019-05-31 NOTE — Telephone Encounter (Signed)
Timothy Torres, at last visit, patient reported he had labs to complete ordered by his PCP. I also placed IgA total and TTG IgA. I do not see that he has had any of these labs completed. Wondering if we could see when he was planning to have all labs completed. Also, he did not complete the [place] I had ordered.

## 2019-06-01 NOTE — Telephone Encounter (Signed)
LMOM for a return call.  

## 2019-06-03 NOTE — Telephone Encounter (Signed)
Pt said he has had a lot going on and he will try to get all labs, including the PCP labs , done in the next couple of weeks.  He will try to reschedule the [place] at some point, since he is still having some issues.

## 2019-06-03 NOTE — Telephone Encounter (Signed)
Noted  

## 2019-08-11 ENCOUNTER — Other Ambulatory Visit (HOSPITAL_COMMUNITY)
Admission: RE | Admit: 2019-08-11 | Discharge: 2019-08-11 | Disposition: A | Discharge status: Home / Self Care | Payer: Managed Care, Other (non HMO) | Source: Ambulatory Visit | Attending: Gastroenterology | Admitting: Gastroenterology

## 2019-08-11 ENCOUNTER — Other Ambulatory Visit: Payer: Self-pay

## 2019-08-12 ENCOUNTER — Telehealth: Payer: Self-pay | Admitting: *Deleted

## 2019-08-12 NOTE — Telephone Encounter (Signed)
Received call from endo patient patient NS for covid test.  Called patient. He states he advised the pre service center Monday he was cancelling. He states he is not paying the cost of the procedure. He did not want to r/s. Called endo and LMOVM making aware to cancel. FYI to [facility]

## 2019-08-12 NOTE — Telephone Encounter (Signed)
Noted. Please let patient know he doesn't have a follow-up appointment. If he would like to schedule one, he can.

## 2019-08-13 ENCOUNTER — Encounter (HOSPITAL_COMMUNITY): Payer: Self-pay

## 2019-08-13 ENCOUNTER — Ambulatory Visit (HOSPITAL_COMMUNITY): Admit: 2019-08-13 | Payer: Managed Care, Other (non HMO) | Admitting: Gastroenterology

## 2019-08-13 SURGERY — COLONOSCOPY WITH PROPOFOL
Anesthesia: Moderate Sedation

## 2020-05-16 ENCOUNTER — Other Ambulatory Visit: Payer: Self-pay | Admitting: Family Medicine

## 2020-05-16 DIAGNOSIS — F418 Other specified anxiety disorders: Secondary | ICD-10-CM

## 2020-05-17 ENCOUNTER — Other Ambulatory Visit: Payer: Self-pay | Admitting: Family Medicine

## 2020-05-17 DIAGNOSIS — F418 Other specified anxiety disorders: Secondary | ICD-10-CM

## 2020-05-17 NOTE — Telephone Encounter (Signed)
Pt called to see if we received refill request on his Lexapro Rx. Explained to pt that we did receive it but it was denied for refill because pt is overdue for an appt. Pt scheduled an appt to see Dr Nadine Counts on 07/03/20 (first available appt slot) and is requesting that Dr Nadine Counts send in enough of the medicine to last him until he can come in for his appt.

## 2020-05-17 NOTE — Telephone Encounter (Signed)
Patient was last seen 04/30/19 and was given #90 R-3 for his lexapro.  Appointment scheduled for 07/03/20.  Wanting to know if enough medication can be sent in for him.  Since he was seen x 1 year ago please advise

## 2020-07-03 ENCOUNTER — Other Ambulatory Visit: Payer: Self-pay

## 2020-07-03 ENCOUNTER — Encounter: Payer: Self-pay | Admitting: Family Medicine

## 2020-07-03 ENCOUNTER — Ambulatory Visit (INDEPENDENT_AMBULATORY_CARE_PROVIDER_SITE_OTHER): Payer: Managed Care, Other (non HMO) | Admitting: Family Medicine

## 2020-07-03 VITALS — BP 133/82 | HR 87 | Temp 97.1°F | Ht 67.0 in

## 2020-07-03 DIAGNOSIS — Z125 Encounter for screening for malignant neoplasm of prostate: Secondary | ICD-10-CM | POA: Diagnosis not present

## 2020-07-03 DIAGNOSIS — F418 Other specified anxiety disorders: Secondary | ICD-10-CM

## 2020-07-03 DIAGNOSIS — E782 Mixed hyperlipidemia: Secondary | ICD-10-CM | POA: Diagnosis not present

## 2020-07-03 DIAGNOSIS — K921 Melena: Secondary | ICD-10-CM | POA: Diagnosis not present

## 2020-07-03 DIAGNOSIS — E669 Obesity, unspecified: Secondary | ICD-10-CM

## 2020-07-03 MED ORDER — ESCITALOPRAM OXALATE 20 MG PO TABS: 20.0000 mg | ORAL_TABLET | Freq: Every day | ORAL | 3 refills | Status: DC

## 2020-07-03 NOTE — Progress Notes (Signed)
Subjective: CC: Follow-up generalized anxiety disorder, depression PCP: Janora Norlander, DO Timothy Torres is a 44 y.o. male presenting to clinic today for:  1.  Generalized anxiety disorder, depression He remarried and is in a healthy, happy relationship.  He also seems to be having a fairly good relationship with his ex-wife and her spouse.  Since her last visit, patient has been doing extremely well.  He changed jobs and is in a job that is much less stressful.  Things are going well from that standpoint.  He does admit that he has difficulty with sex drive.  He is physically able to perform but often does not have the urge to initiate.  He wonders if there is something that he can do for this.  He does feel the need for ongoing use of Lexapro as he does see a huge difference in his mood if he does not take it.  2.  Hyperlipidemia/obesity Patient is not currently treated with medication.  He identifies his weight is a concern today and admits that he became out of breath with trying to push a car recently.  He is physical at work and walks quite a bit but does not use a pedometer to count steps.  He does admit that he does not have a restriction on what he eats and just eats what is in front of him.  No reports of chest pain or shortness of breath but there is a family history of early cardiovascular disease.  3.  Nausea, hematochezia Patient reports hematochezia has resolved since the last visit.  He was set up to get a scope done with gastroenterology at the beginning of the year but was told it would be over $5000 upfront before that he could proceed with this and this was not something that he was willing to do.  He continues to have intermittent nausea this seems to be related to certain smells like grease.  No vomiting.  No unplanned weight loss.  He is tolerating p.o. intake without difficulty.  PPI has not been beneficial in the past   ROS: Per HPI  No Known Allergies Past  Medical History:  Diagnosis Date  . Anxiety   . Complication of anesthesia    hard time waking up, n and v  . PONV (postoperative nausea and vomiting)     Current Outpatient Medications:  .  bismuth subsalicylate (PEPTO BISMOL) 262 MG chewable tablet, Chew 524 mg by mouth as needed., Disp: , Rfl:  .  escitalopram (LEXAPRO) 20 MG tablet, Take 1 tablet (20 mg total) by mouth daily., Disp: 90 tablet, Rfl: 3 Social History   Socioeconomic History  . Marital status: Married    Spouse name: Not on file  . Number of children: Not on file  . Years of education: Not on file  . Highest education level: Not on file  Occupational History  . Not on file  Tobacco Use  . Smoking status: Never Smoker  . Smokeless tobacco: Never Used  Vaping Use  . Vaping Use: Never used  Substance and Sexual Activity  . Alcohol use: Not Currently  . Drug use: No  . Sexual activity: Not on file  Other Topics Concern  . Not on file  Social History Narrative  . Not on file   Social Determinants of Health   Financial Resource Strain: Not on file  Food Insecurity: Not on file  Transportation Needs: Not on file  Physical Activity: Not on file  Stress: Not on file  Social Connections: Not on file  Intimate Partner Violence: Not on file   Family History  Problem Relation Age of Onset  . Hypertension Mother   . Thyroid disease Mother   . Depression Mother   . Hypertension Father   . Thyroid disease Father   . Heart disease Maternal Grandfather   . Stroke Paternal Grandfather   . Depression Sister   . Cancer Neg Hx   . Diabetes Neg Hx   . Colon cancer Neg Hx     Objective: Office vital signs reviewed. BP 133/82   Pulse 87   Temp (!) 97.1 F (36.2 C) (Temporal)   Ht 5' 7"  (1.702 m)   SpO2 98%   BMI 35.62 kg/m   Physical Examination:  General: Awake, alert, well nourished, No acute distress HEENT: Normal, sclera white Cardio: regular rate and rhythm, S1S2 heard, no murmurs  appreciated Pulm: clear to auscultation bilaterally, no wheezes, rhonchi or rales; normal work of breathing on room air GI: Flat, soft, nontender, nondistended. Extremities: warm, well perfused, No edema, cyanosis or clubbing; +2 pulses bilaterally Psych: Mood stable, speech normal, affect appropriate, pleasant and interactive  Depression screen [facility] 2/9 07/03/2020 04/30/2019 04/29/2018  Decreased Interest 0 0 0  Down, Depressed, Hopeless 0 0 0  PHQ - 2 Score 0 0 0  Altered sleeping 0 - -  Tired, decreased energy 0 - -  Change in appetite 0 - -  Feeling bad or failure about yourself  0 - -  Trouble concentrating 0 - -  Moving slowly or fidgety/restless 0 - -  Suicidal thoughts 0 - -  PHQ-9 Score 0 - -  Difficult doing work/chores - - -    Assessment/ Plan: 44 y.o. male   Depression with anxiety - Plan: escitalopram (LEXAPRO) 20 MG tablet  Mixed hyperlipidemia - Plan: CMP14+EGFR, Lipid panel, TSH  Hematochezia - Plan: CBC  Screening for malignant neoplasm of prostate - Plan: PSA, CBC  Obesity, Class II, BMI 35-39.9 - Plan: Bayer DCA Hb A1c Waived   Symptoms are well controlled with Lexapro but he is having some sexual side effects.  He unfortunately did not tolerate Wellbutrin in the past so I hesitate to add this back.  We discussed consideration for sex therapy.  There are some great offices in [place] couples counseling.  He will come in for fasting lipid.  Hematochezia really has resolved but will check a CBC Plan for PSA.  Advised to not engage in intercourse for 24 hours leading up to lab  Check A1c.  We talked about weight loss, use of Mediterranean diet, increasing physical activity.  We discussed consideration for phentermine versus Saxenda if needed going forward.  I had like to see him back in February for full physical exam.  We can reassess his weight at that time.   Orders Placed This Encounter  Procedures  . CMP14+EGFR    Standing Status:   Future     Standing Expiration Date:   07/03/2021  . Lipid panel    Standing Status:   Future    Standing Expiration Date:   07/03/2021  . TSH    Standing Status:   Future    Standing Expiration Date:   07/03/2021  . PSA    Standing Status:   Future    Standing Expiration Date:   07/03/2021  . CBC    Standing Status:   Future    Standing Expiration Date:   07/03/2021  .  Bayer DCA Hb A1c Waived    Standing Status:   Future    Standing Expiration Date:   07/03/2021   Meds ordered this encounter  Medications  . escitalopram (LEXAPRO) 20 MG tablet    Sig: Take 1 tablet (20 mg total) by mouth daily.    Dispense:  90 tablet    Refill:  [place], [place] [phone]

## 2020-07-03 NOTE — Patient Instructions (Signed)
Fasting labs ordered.  See me back sometime in February for your annual check up and to review your labs.  We talked about Phentermine (weight loss pill) and Saxenda (injectable)  For your nausea, I think you need an EGD.  I want you to follow up with your GI doctor.  Mediterranean Diet A Mediterranean diet refers to food and lifestyle choices that are based on the traditions of countries located on the [employer] [place]. This way of eating has been shown to help prevent certain conditions and improve outcomes for people who have chronic diseases, like kidney disease and heart disease. What are tips for following this plan? Lifestyle  Cook and eat meals together with your family, when possible.  Drink enough fluid to keep your urine clear or pale yellow.  Be physically active every day. This includes: ? Aerobic exercise like running or swimming. ? Leisure activities like gardening, walking, or housework.  Get 7-8 hours of sleep each night.  If recommended by your health care provider, drink red wine in moderation. This means 1 glass a day for nonpregnant women and 2 glasses a day for men. A glass of wine equals 5 oz (150 mL). Reading food labels   Check the serving size of packaged foods. For foods such as rice and pasta, the serving size refers to the amount of cooked product, not dry.  Check the total fat in packaged foods. Avoid foods that have saturated fat or trans fats.  Check the ingredients list for added sugars, such as corn syrup. Shopping  At the grocery store, buy most of your food from the areas near the walls of the store. This includes: ? Fresh fruits and vegetables (produce). ? Grains, beans, nuts, and seeds. Some of these may be available in unpackaged forms or large amounts (in bulk). ? Fresh seafood. ? Poultry and eggs. ? Low-fat dairy products.  Buy whole ingredients instead of prepackaged foods.  Buy fresh fruits and vegetables in-season from local  farmers markets.  Buy frozen fruits and vegetables in resealable bags.  If you do not have access to quality fresh seafood, buy precooked frozen shrimp or canned fish, such as tuna, salmon, or sardines.  Buy small amounts of raw or cooked vegetables, salads, or olives from the deli or salad bar at your store.  Stock your pantry so you always have certain foods on hand, such as olive oil, canned tuna, canned tomatoes, rice, pasta, and beans. Cooking  Cook foods with extra-virgin olive oil instead of using butter or other vegetable oils.  Have meat as a side dish, and have vegetables or grains as your main dish. This means having meat in small portions or adding small amounts of meat to foods like pasta or stew.  Use beans or vegetables instead of meat in common dishes like chili or lasagna.  Experiment with different cooking methods. Try roasting or broiling vegetables instead of steaming or sauteing them.  Add frozen vegetables to soups, stews, pasta, or rice.  Add nuts or seeds for added healthy fat at each meal. You can add these to yogurt, salads, or vegetable dishes.  Marinate fish or vegetables using olive oil, lemon juice, garlic, and fresh herbs. Meal planning   Plan to eat 1 vegetarian meal one day each week. Try to work up to 2 vegetarian meals, if possible.  Eat seafood 2 or more times a week.  Have healthy snacks readily available, such as: ? Vegetable sticks with hummus. ? [place] yogurt. ?  Fruit and nut trail mix.  Eat balanced meals throughout the week. This includes: ? Fruit: 2-3 servings a day ? Vegetables: 4-5 servings a day ? Low-fat dairy: 2 servings a day ? Fish, poultry, or lean meat: 1 serving a day ? Beans and legumes: 2 or more servings a week ? Nuts and seeds: 1-2 servings a day ? Whole grains: 6-8 servings a day ? Extra-virgin olive oil: 3-4 servings a day  Limit red meat and sweets to only a few servings a month What are my food  choices?  Mediterranean diet ? Recommended  Grains: Whole-grain pasta. Brown rice. Bulgar wheat. Polenta. Couscous. Whole-wheat bread. [place].  Vegetables: Artichokes. Beets. Broccoli. Cabbage. Carrots. Eggplant. Green beans. Chard. Kale. Spinach. Onions. Leeks. Peas. Squash. Tomatoes. Peppers. Radishes.  Fruits: Apples. Apricots. Avocado. Berries. Bananas. Cherries. Dates. Figs. Grapes. Lemons. Melon. Oranges. Peaches. Plums. Pomegranate.  Meats and other protein foods: Beans. Almonds. Sunflower seeds. Pine nuts. Peanuts. [place]. Salmon. Scallops. Shrimp. [place]. Tilapia. Clams. Oysters. Eggs.  Dairy: Low-fat milk. Cheese. Greek yogurt.  Beverages: Water. Red wine. Herbal tea.  Fats and oils: Extra virgin olive oil. Avocado oil. Grape seed oil.  Sweets and desserts: [place] yogurt with honey. Baked apples. Poached pears. Trail mix.  Seasoning and other foods: Basil. Cilantro. Coriander. Cumin. Mint. Parsley. Sage. Rosemary. Tarragon. Garlic. Oregano. Thyme. Pepper. Balsalmic vinegar. Tahini. Hummus. Tomato sauce. Olives. Mushrooms. ? Limit these  Grains: Prepackaged pasta or rice dishes. Prepackaged cereal with added sugar.  Vegetables: Deep fried potatoes (french fries).  Fruits: Fruit canned in syrup.  Meats and other protein foods: Beef. Pork. Lamb. Poultry with skin. Hot dogs. [place] Salines.  Dairy: Ice cream. Sour cream. Whole milk.  Beverages: Juice. Sugar-sweetened soft drinks. Beer. Liquor and spirits.  Fats and oils: Butter. Canola oil. Vegetable oil. Beef fat (tallow). Lard.  Sweets and desserts: Cookies. Cakes. Pies. Candy.  Seasoning and other foods: Mayonnaise. Premade sauces and marinades. The items listed may not be a complete list. Talk with your dietitian about what dietary choices are right for you. Summary  The Mediterranean diet includes both food and lifestyle choices.  Eat a variety of fresh fruits and vegetables, beans, nuts, seeds, and whole  grains.  Limit the amount of red meat and sweets that you eat.  Talk with your health care provider about whether it is safe for you to drink red wine in moderation. This means 1 glass a day for nonpregnant women and 2 glasses a day for men. A glass of wine equals 5 oz (150 mL). This information is not intended to replace advice given to you by your health care provider. Make sure you discuss any questions you have with your health care provider. Document Revised: 03/07/2016 Document Reviewed: 02/29/2016 Elsevier Patient Education  [place].

## 2020-08-24 ENCOUNTER — Other Ambulatory Visit: Payer: Self-pay | Admitting: Family Medicine

## 2020-08-24 DIAGNOSIS — F418 Other specified anxiety disorders: Secondary | ICD-10-CM

## 2020-09-04 ENCOUNTER — Encounter: Payer: Self-pay | Admitting: Family Medicine

## 2020-09-04 ENCOUNTER — Other Ambulatory Visit: Payer: Self-pay

## 2020-09-04 ENCOUNTER — Ambulatory Visit (INDEPENDENT_AMBULATORY_CARE_PROVIDER_SITE_OTHER): Payer: Managed Care, Other (non HMO) | Admitting: Family Medicine

## 2020-09-04 VITALS — BP 124/78 | HR 97 | Temp 98.7°F | Ht 67.0 in | Wt 236.0 lb

## 2020-09-04 DIAGNOSIS — R431 Parosmia: Secondary | ICD-10-CM

## 2020-09-04 DIAGNOSIS — Z0001 Encounter for general adult medical examination with abnormal findings: Secondary | ICD-10-CM

## 2020-09-04 DIAGNOSIS — F418 Other specified anxiety disorders: Secondary | ICD-10-CM

## 2020-09-04 DIAGNOSIS — E782 Mixed hyperlipidemia: Secondary | ICD-10-CM

## 2020-09-04 DIAGNOSIS — L858 Other specified epidermal thickening: Secondary | ICD-10-CM

## 2020-09-04 DIAGNOSIS — L739 Follicular disorder, unspecified: Secondary | ICD-10-CM | POA: Diagnosis not present

## 2020-09-04 DIAGNOSIS — Z0000 Encounter for general adult medical examination without abnormal findings: Secondary | ICD-10-CM

## 2020-09-04 NOTE — Progress Notes (Signed)
Timothy Torres is a 45 y.o. male presents to office today for annual physical exam examination.    Concerns today include: 1.  None  Occupation: Works a Network engineer job, Marital status: Married, Substance use: None Diet: Fair, Exercise: No structured Last colonoscopy: Was due for EGD but was over $5000 he did not pursue.  Continues to have dysphagia and as result chops his food very finally said that he does not have globus sensation.  He has chronic nausea and has had this since a child.  PPIs have not helped in the past Refills needed today: None Immunizations needed:  Immunization History  Administered Date(s) Administered  . Moderna Sars-Covid-2 Vaccination 05/19/2020, 06/18/2020  . Tdap 11/01/2014     Past Medical History:  Diagnosis Date  . Anxiety   . Complication of anesthesia    hard time waking up, n and v  . PONV (postoperative nausea and vomiting)    Social History   Socioeconomic History  . Marital status: Married    Spouse name: Not on file  . Number of children: Not on file  . Years of education: Not on file  . Highest education level: Not on file  Occupational History  . Not on file  Tobacco Use  . Smoking status: Never Smoker  . Smokeless tobacco: Never Used  Vaping Use  . Vaping Use: Never used  Substance and Sexual Activity  . Alcohol use: Not Currently  . Drug use: No  . Sexual activity: Not on file  Other Topics Concern  . Not on file  Social History Narrative  . Not on file   Social Determinants of Health   Financial Resource Strain: Not on file  Food Insecurity: Not on file  Transportation Needs: Not on file  Physical Activity: Not on file  Stress: Not on file  Social Connections: Not on file  Intimate Partner Violence: Not on file   Past Surgical History:  Procedure Laterality Date  . APPENDECTOMY    . LAPAROSCOPIC APPENDECTOMY  04/15/2012   Procedure: APPENDECTOMY LAPAROSCOPIC;  Surgeon: Ardeth Sportsman, MD;  Location: [facility]  [facility];  Service: General;  Laterality: N/A;  . WISDOM TOOTH EXTRACTION     Family History  Problem Relation Age of Onset  . Hypertension Mother   . Thyroid disease Mother   . Depression Mother   . Hypertension Father   . Thyroid disease Father   . Heart disease Maternal Grandfather   . Stroke Paternal Grandfather   . Depression Sister   . Cancer Neg Hx   . Diabetes Neg Hx   . Colon cancer Neg Hx     Current Outpatient Medications:  .  bismuth subsalicylate (PEPTO BISMOL) 262 MG chewable tablet, Chew 524 mg by mouth as needed., Disp: , Rfl:  .  escitalopram (LEXAPRO) 20 MG tablet, Take 1 tablet by mouth once daily, Disp: 90 tablet, Rfl: 0  No Known Allergies   ROS: Review of Systems A comprehensive review of systems was negative except for: Gastrointestinal: positive for dysphagia and nausea Integument/breast: positive for skin lesion(s) Behavioral/Psych: positive for controlled depression/ anxiety    Physical exam BP 124/78   Pulse 97   Temp 98.7 F (37.1 C) (Temporal)   Ht 5\' 7"  (1.702 m)   Wt 236 lb (107 kg)   SpO2 97%   BMI 36.96 kg/m  General appearance: alert, cooperative, appears stated age, no distress and mildly obese Head: Normocephalic, without obvious abnormality, atraumatic Eyes: negative findings: lids and  lashes normal, conjunctivae and sclerae normal, corneas clear and pupils equal, round, reactive to light and accomodation Ears: normal TM's and external ear canals both ears Nose: Nares normal. Septum midline. Mucosa normal. No drainage or sinus tenderness. Throat: lips, mucosa, and tongue normal; teeth and gums normal Neck: no adenopathy, no carotid bruit, supple, symmetrical, trachea midline and thyroid not enlarged, symmetric, no tenderness/mass/nodules Back: symmetric, no curvature. ROM normal. No CVA tenderness. Lungs: clear to auscultation bilaterally Chest wall: no tenderness Heart: regular rate and rhythm, S1, S2 normal, no murmur, click, rub or  gallop Abdomen: soft, non-tender; bowel sounds normal; no masses,  no organomegaly Extremities: extremities normal, atraumatic, no cyanosis or edema Pulses: 2+ and symmetric Skin: He has a postinflammatory area of hyperpigmentation noted along the right posterior mid back consistent with previous folliculitis and subsequent scarring.  He also has keratosis pilaris noted along the posterior upper arms Lymph nodes: Cervical, supraclavicular, and axillary nodes normal. Neurologic: Alert and oriented X 3, normal strength and tone. Normal symmetric reflexes. Normal coordination and gait  Psych: Mood stable, speech normal  Depression screen [facility] 2/9 09/04/2020 07/03/2020 04/30/2019  Decreased Interest 0 0 0  Down, Depressed, Hopeless 0 0 0  PHQ - 2 Score 0 0 0  Altered sleeping 0 0 -  Tired, decreased energy 0 0 -  Change in appetite 0 0 -  Feeling bad or failure about yourself  0 0 -  Trouble concentrating 0 0 -  Moving slowly or fidgety/restless 0 0 -  Suicidal thoughts 0 0 -  PHQ-9 Score 0 0 -  Difficult doing work/chores - - -    Assessment/ Plan: Timothy Torres here for annual physical exam.   Annual physical exam  Mixed hyperlipidemia  Depression with anxiety  Folliculitis  Keratosis pilaris  Sensitive to smells  Seems to be doing well.  He will come in for his fasting labs which were ordered back in December.  His depression and anxiety have been extremely well controlled with Lexapro.  Continue current regimen  Folliculitis and keratosis Pilar is noted on the skin.  Discussed exfoliation  He admitted that the chronic nausea he experiences seems to be related to smell sensitivity.  I asked him to challenge with Vicks VapoRub under his nose to see if perhaps this would reduce sensitivity to other things.  He continues to have dysphagia and would recommend that some point he sees gastroenterology for pursue of EGD again.  However, I agree $5000 is quite expensive and most  certainly understand why he would put this off for now.  Counseled on healthy lifestyle choices, including diet (rich in fruits, vegetables and lean meats and low in salt and simple carbohydrates) and exercise (at least 30 minutes of moderate physical activity daily).  Patient to follow up in 1 year for annual exam or sooner if needed.  Ashly M. Nadine Counts, DO

## 2020-09-04 NOTE — Patient Instructions (Signed)
Come in for your labs fasting  Keratosis pilaris = spots on your arms (exfoliation will help reduce both issues) Folliculitis = spot on your side  Try vick vapor rub to see if this helps with decreasing smell sensitivity  Preventive Care 80-43 Years Old, Male Preventive care refers to lifestyle choices and visits with your health care provider that can promote health and wellness. This includes:  A yearly physical exam. This is also called an annual wellness visit.  Regular dental and eye exams.  Immunizations.  Screening for certain conditions.  Healthy lifestyle choices, such as: ? Eating a healthy diet. ? Getting regular exercise. ? Not using drugs or products that contain nicotine and tobacco. ? Limiting alcohol use. What can I expect for my preventive care visit? Physical exam Your health care provider will check your:  Height and weight. These may be used to calculate your BMI (body mass index). BMI is a measurement that tells if you are at a healthy weight.  Heart rate and blood pressure.  Body temperature.  Skin for abnormal spots. Counseling Your health care provider may ask you questions about your:  Past medical problems.  Family's medical history.  Alcohol, tobacco, and drug use.  Emotional well-being.  Home life and relationship well-being.  Sexual activity.  Diet, exercise, and sleep habits.  Work and work Astronomer.  Access to firearms. What immunizations do I need? Vaccines are usually given at various ages, according to a schedule. Your health care provider will recommend vaccines for you based on your age, medical history, and lifestyle or other factors, such as travel or where you work.   What tests do I need? Blood tests  Lipid and cholesterol levels. These may be checked every 5 years, or more often if you are over 58 years old.  Hepatitis C test.  Hepatitis B test. Screening  Lung cancer screening. You may have this screening  every year starting at age 35 if you have a 30-pack-year history of smoking and currently smoke or have quit within the past 15 years.  Prostate cancer screening. Recommendations will vary depending on your family history and other risks.  Genital exam to check for testicular cancer or hernias.  Colorectal cancer screening. ? All adults should have this screening starting at age 56 and continuing until age 28. ? Your health care provider may recommend screening at age 25 if you are at increased risk. ? You will have tests every 1-10 years, depending on your results and the type of screening test.  Diabetes screening. ? This is done by checking your blood sugar (glucose) after you have not eaten for a while (fasting). ? You may have this done every 1-3 years.  STD (sexually transmitted disease) testing, if you are at risk. Follow these instructions at home: Eating and drinking  Eat a diet that includes fresh fruits and vegetables, whole grains, lean protein, and low-fat dairy products.  Take vitamin and mineral supplements as recommended by your health care provider.  Do not drink alcohol if your health care provider tells you not to drink.  If you drink alcohol: ? Limit how much you have to 0-2 drinks a day. ? Be aware of how much alcohol is in your drink. In the [place] one drink equals one 12 oz bottle of beer (355 mL), one 5 oz glass of wine (148 mL), or one 1 oz glass of hard liquor (44 mL).   Lifestyle  Take daily care of your teeth and  gums. Brush your teeth every morning and night with fluoride toothpaste. Floss one time each day.  Stay active. Exercise for at least 30 minutes 5 or more days each week.  Do not use any products that contain nicotine or tobacco, such as cigarettes, e-cigarettes, and chewing tobacco. If you need help quitting, ask your health care provider.  Do not use drugs.  If you are sexually active, practice safe sex. Use a condom or other form of  protection to prevent STIs (sexually transmitted infections).  If told by your health care provider, take low-dose aspirin daily starting at age 22.  Find healthy ways to cope with stress, such as: ? Meditation, yoga, or listening to music. ? Journaling. ? Talking to a trusted person. ? Spending time with friends and family. Safety  Always wear your seat belt while driving or riding in a vehicle.  Do not drive: ? If you have been drinking alcohol. Do not ride with someone who has been drinking. ? When you are tired or distracted. ? While texting.  Wear a helmet and other protective equipment during sports activities.  If you have firearms in your house, make sure you follow all gun safety procedures. What's next?  Go to your health care provider once a year for an annual wellness visit.  Ask your health care provider how often you should have your eyes and teeth checked.  Stay up to date on all vaccines. This information is not intended to replace advice given to you by your health care provider. Make sure you discuss any questions you have with your health care provider. Document Revised: 04/06/2019 Document Reviewed: 07/02/2018 Elsevier Patient Education  2021 [employer].

## 2021-05-25 ENCOUNTER — Other Ambulatory Visit: Payer: Self-pay

## 2021-05-25 ENCOUNTER — Other Ambulatory Visit: Payer: Managed Care, Other (non HMO)

## 2021-05-25 DIAGNOSIS — Z125 Encounter for screening for malignant neoplasm of prostate: Secondary | ICD-10-CM

## 2021-05-25 DIAGNOSIS — E669 Obesity, unspecified: Secondary | ICD-10-CM

## 2021-05-25 DIAGNOSIS — E782 Mixed hyperlipidemia: Secondary | ICD-10-CM

## 2021-05-25 DIAGNOSIS — K921 Melena: Secondary | ICD-10-CM

## 2021-05-25 LAB — BAYER DCA HB A1C WAIVED: HB A1C (BAYER DCA - WAIVED): 5.6 % (ref 4.8–5.6)

## 2021-05-26 LAB — LIPID PANEL
Chol/HDL Ratio: 5 ratio (ref 0.0–5.0)
Cholesterol, Total: 221 mg/dL — ABNORMAL HIGH (ref 100–199)
HDL: 44 mg/dL (ref >39)
LDL Chol Calc (NIH): 114 mg/dL — ABNORMAL HIGH (ref 0–99)
Triglycerides: 361 mg/dL — ABNORMAL HIGH (ref 0–149)
VLDL Cholesterol Cal: 63 mg/dL — ABNORMAL HIGH (ref 5–40)

## 2021-05-26 LAB — CBC
Hematocrit: 46.7 % (ref 37.5–51.0)
Hemoglobin: 16.2 g/dL (ref 13.0–17.7)
MCH: 32.1 pg (ref 26.6–33.0)
MCHC: 34.7 g/dL (ref 31.5–35.7)
MCV: 93 fL (ref 79–97)
Platelets: 279 10*3/uL (ref 150–450)
RBC: 5.05 x10E6/uL (ref 4.14–5.80)
RDW: 13.1 % (ref 11.6–15.4)
WBC: 7.2 10*3/uL (ref 3.4–10.8)

## 2021-05-26 LAB — CMP14+EGFR
ALT: 38 IU/L (ref 0–44)
AST: 26 IU/L (ref 0–40)
Albumin/Globulin Ratio: 2 (ref 1.2–2.2)
Albumin: 4.8 g/dL (ref 4.0–5.0)
Alkaline Phosphatase: 63 IU/L (ref 44–121)
BUN/Creatinine Ratio: 10 (ref 9–20)
BUN: 10 mg/dL (ref 6–24)
Bilirubin Total: 0.3 mg/dL (ref 0.0–1.2)
CO2: 22 mmol/L (ref 20–29)
Calcium: 9.5 mg/dL (ref 8.7–10.2)
Chloride: 103 mmol/L (ref 96–106)
Creatinine, Ser: 0.96 mg/dL (ref 0.76–1.27)
Globulin, Total: 2.4 g/dL (ref 1.5–4.5)
Glucose: 81 mg/dL (ref 70–99)
Potassium: 4.5 mmol/L (ref 3.5–5.2)
Sodium: 140 mmol/L (ref 134–144)
Total Protein: 7.2 g/dL (ref 6.0–8.5)
eGFR: 101 mL/min/{1.73_m2} (ref >59)

## 2021-05-26 LAB — PSA: Prostate Specific Ag, Serum: 0.5 ng/mL (ref 0.0–4.0)

## 2021-05-26 LAB — TSH: TSH: 2.88 u[IU]/mL (ref 0.450–4.500)

## 2021-05-29 ENCOUNTER — Other Ambulatory Visit: Payer: Self-pay | Admitting: Family Medicine

## 2021-05-29 DIAGNOSIS — E782 Mixed hyperlipidemia: Secondary | ICD-10-CM

## 2021-05-29 MED ORDER — FENOFIBRATE 145 MG PO TABS: 145.0000 mg | ORAL_TABLET | Freq: Every day | ORAL | 3 refills | Status: DC

## 2021-08-24 ENCOUNTER — Other Ambulatory Visit: Payer: Self-pay | Admitting: Family Medicine

## 2021-08-24 DIAGNOSIS — F418 Other specified anxiety disorders: Secondary | ICD-10-CM

## 2021-09-03 ENCOUNTER — Telehealth: Payer: Self-pay | Admitting: Family Medicine

## 2021-09-05 ENCOUNTER — Ambulatory Visit (INDEPENDENT_AMBULATORY_CARE_PROVIDER_SITE_OTHER): Payer: Managed Care, Other (non HMO) | Admitting: Family Medicine

## 2021-09-05 ENCOUNTER — Encounter: Payer: Self-pay | Admitting: Family Medicine

## 2021-09-05 VITALS — BP 132/84 | HR 90 | Temp 97.6°F | Ht 67.0 in | Wt 226.6 lb

## 2021-09-05 DIAGNOSIS — Z0000 Encounter for general adult medical examination without abnormal findings: Secondary | ICD-10-CM

## 2021-09-05 DIAGNOSIS — E782 Mixed hyperlipidemia: Secondary | ICD-10-CM

## 2021-09-05 DIAGNOSIS — F418 Other specified anxiety disorders: Secondary | ICD-10-CM | POA: Diagnosis not present

## 2021-09-05 DIAGNOSIS — Z0001 Encounter for general adult medical examination with abnormal findings: Secondary | ICD-10-CM

## 2021-09-05 DIAGNOSIS — T753XXA Motion sickness, initial encounter: Secondary | ICD-10-CM | POA: Diagnosis not present

## 2021-09-05 DIAGNOSIS — R110 Nausea: Secondary | ICD-10-CM

## 2021-09-05 MED ORDER — SCOPOLAMINE 1 MG/3DAYS TD PT72: 1.0000 | MEDICATED_PATCH | TRANSDERMAL | 0 refills | Status: DC

## 2021-09-05 MED ORDER — MECLIZINE HCL 25 MG PO TABS: 25.0000 mg | ORAL_TABLET | Freq: Three times a day (TID) | ORAL | 0 refills | Status: DC | PRN

## 2021-09-05 MED ORDER — ONDANSETRON 4 MG PO TBDP: 4.0000 mg | ORAL_TABLET | Freq: Three times a day (TID) | ORAL | 0 refills | Status: DC | PRN

## 2021-09-05 NOTE — Patient Instructions (Signed)
You had labs performed today.  You will be contacted with the results of the labs once they are available, usually in the next 3 business days for routine lab work.  If you have an active my chart account, they will be released to your MyChart.  If you prefer to have these labs released to you via telephone, please let us know.   Preventive Care 40-44 Years Old, Male Preventive care refers to lifestyle choices and visits with your health care provider that can promote health and wellness. Preventive care visits are also called wellness exams. What can I expect for my preventive care visit? Counseling During your preventive care visit, your health care provider may ask about your: Medical history, including: Past medical problems. Family medical history. Current health, including: Emotional well-being. Home life and relationship well-being. Sexual activity. Lifestyle, including: Alcohol, nicotine or tobacco, and drug use. Access to firearms. Diet, exercise, and sleep habits. Safety issues such as seatbelt and bike helmet use. Sunscreen use. Work and work environment. Physical exam Your health care provider will check your: Height and weight. These may be used to calculate your BMI (body mass index). BMI is a measurement that tells if you are at a healthy weight. Waist circumference. This measures the distance around your waistline. This measurement also tells if you are at a healthy weight and may help predict your risk of certain diseases, such as type 2 diabetes and high blood pressure. Heart rate and blood pressure. Body temperature. Skin for abnormal spots. What immunizations do I need? Vaccines are usually given at various ages, according to a schedule. Your health care provider will recommend vaccines for you based on your age, medical history, and lifestyle or other factors, such as travel or where you work. What tests do I need? Screening Your health care provider may recommend  screening tests for certain conditions. This may include: Lipid and cholesterol levels. Diabetes screening. This is done by checking your blood sugar (glucose) after you have not eaten for a while (fasting). Hepatitis B test. Hepatitis C test. HIV (human immunodeficiency virus) test. STI (sexually transmitted infection) testing, if you are at risk. Lung cancer screening. Prostate cancer screening. Colorectal cancer screening. Talk with your health care provider about your test results, treatment options, and if necessary, the need for more tests. Follow these instructions at home: Eating and drinking  Eat a diet that includes fresh fruits and vegetables, whole grains, lean protein, and low-fat dairy products. Take vitamin and mineral supplements as recommended by your health care provider. Do not drink alcohol if your health care provider tells you not to drink. If you drink alcohol: Limit how much you have to 0-2 drinks a day. Know how much alcohol is in your drink. In the [place] one drink equals one 12 oz bottle of beer (355 mL), one 5 oz glass of wine (148 mL), or one 1 oz glass of hard liquor (44 mL). Lifestyle Brush your teeth every morning and night with fluoride toothpaste. Floss one time each day. Exercise for at least 30 minutes 5 or more days each week. Do not use any products that contain nicotine or tobacco. These products include cigarettes, chewing tobacco, and vaping devices, such as e-cigarettes. If you need help quitting, ask your health care provider. Do not use drugs. If you are sexually active, practice safe sex. Use a condom or other form of protection to prevent STIs. Take aspirin only as told by your health care provider. Make sure that   you understand how much to take and what form to take. Work with your health care provider to find out whether it is safe and beneficial for you to take aspirin daily. Find healthy ways to manage stress, such as: Meditation, yoga, or  listening to music. Journaling. Talking to a trusted person. Spending time with friends and family. Minimize exposure to UV radiation to reduce your risk of skin cancer. Safety Always wear your seat belt while driving or riding in a vehicle. Do not drive: If you have been drinking alcohol. Do not ride with someone who has been drinking. When you are tired or distracted. While texting. If you have been using any mind-altering substances or drugs. Wear a helmet and other protective equipment during sports activities. If you have firearms in your house, make sure you follow all gun safety procedures. What's next? Go to your health care provider once a year for an annual wellness visit. Ask your health care provider how often you should have your eyes and teeth checked. Stay up to date on all vaccines. This information is not intended to replace advice given to you by your health care provider. Make sure you discuss any questions you have with your health care provider. Document Revised: 01/03/2021 Document Reviewed: 01/03/2021 Elsevier Patient Education  2022 [employer]   

## 2021-09-05 NOTE — Progress Notes (Signed)
Timothy Torres is a 46 y.o. male presents to office today for annual physical exam examination.    Concerns today include: 1. Hypertriglyceridemia/chronic abdominal pain Started on tricor in November.  No chest pain, shortness of breath, skin changes.  He does report ongoing abdominal pain.  Has melena.  No bright red blood per rectum in the last several months.  Has an appointment today with Dr Therisa Doyne at [facility] gastroenterology for endoscopy and colonoscopy.  Anticipate esophageal stretch during that visit as well.  2.  Motion sickness We will be going on a cruise in about 2 months.  He has bring his daughter to the [place] as a graduation gift.  This will be his first cruise but he anticipates motion sickness and asks for medications to help with this.  Diet: Fair, Exercise: No structured Last colonoscopy: Having colonoscopy and endoscopy performed today at 130 Refills needed today: None Immunizations needed: Immunization History  Administered Date(s) Administered   Moderna Sars-Covid-2 Vaccination 05/19/2020, 06/18/2020   Tdap 11/01/2014     Past Medical History:  Diagnosis Date   Anxiety    Complication of anesthesia    hard time waking up, n and v   PONV (postoperative nausea and vomiting)    Social History   Socioeconomic History   Marital status: Married    Spouse name: Not on file   Number of children: Not on file   Years of education: Not on file   Highest education level: Not on file  Occupational History   Not on file  Tobacco Use   Smoking status: Never   Smokeless tobacco: Never  Vaping Use   Vaping Use: Never used  Substance and Sexual Activity   Alcohol use: Not Currently   Drug use: No   Sexual activity: Not on file  Other Topics Concern   Not on file  Social History Narrative   Not on file   Social Determinants of Health   Financial Resource Strain: Not on file  Food Insecurity: Not on file  Transportation Needs: Not on file  Physical  Activity: Not on file  Stress: Not on file  Social Connections: Not on file  Intimate Partner Violence: Not on file   Past Surgical History:  Procedure Laterality Date   APPENDECTOMY     LAPAROSCOPIC APPENDECTOMY  04/15/2012   Procedure: APPENDECTOMY LAPAROSCOPIC;  Surgeon: Adin Hector, MD;  Location: [facility];  Service: General;  Laterality: N/A;   WISDOM TOOTH EXTRACTION     Family History  Problem Relation Age of Onset   Hypertension Mother    Thyroid disease Mother    Depression Mother    Hypertension Father    Thyroid disease Father    Heart disease Maternal Grandfather    Stroke Paternal Grandfather    Depression Sister    Cancer Neg Hx    Diabetes Neg Hx    Colon cancer Neg Hx     Current Outpatient Medications:    escitalopram (LEXAPRO) 20 MG tablet, Take 1 tablet by mouth once daily, Disp: 30 tablet, Rfl: 0   fenofibrate (TRICOR) 145 MG tablet, Take 1 tablet (145 mg total) by mouth daily., Disp: 90 tablet, Rfl: 3  No Known Allergies   ROS: Review of Systems A comprehensive review of systems was negative except for: Eyes: positive for visual strain intermittently Gastrointestinal: positive for abdominal pain, dysphagia, and nausea    Physical exam BP 132/84    Pulse 90    Temp 97.6 F (  36.4 C)    Ht 5\' 7"  (1.702 m)    Wt 226 lb 9.6 oz (102.8 kg)    SpO2 96%    BMI 35.49 kg/m  General appearance: alert, cooperative, appears stated age, and no distress Head: Normocephalic, without obvious abnormality, atraumatic Eyes: negative findings: lids and lashes normal, conjunctivae and sclerae normal, corneas clear, and pupils equal, round, reactive to light and accomodation Ears: normal TM's and external ear canals both ears Nose: Nares normal. Septum midline. Mucosa normal. No drainage or sinus tenderness. Throat:  Mild oropharyngeal erythema.  No ulcerations or masses appreciated.  Moist mucous membranes Neck: no adenopathy, no carotid bruit, supple, symmetrical,  trachea midline, and thyroid not enlarged, symmetric, no tenderness/mass/nodules Back: symmetric, no curvature. ROM normal. No CVA tenderness. Lungs: clear to auscultation bilaterally Chest wall: no tenderness Heart: regular rate and rhythm, S1, S2 normal, no murmur, click, rub or gallop Abdomen:  Mildly distended.  Epigastric tenderness palpation present.  Tenderness palpation along the left upper and lower quadrants of the abdomen present.  No guarding or rebound. Extremities: extremities normal, atraumatic, no cyanosis or edema Pulses: 2+ and symmetric Skin: Skin color, texture, turgor normal. No rashes or lesions Lymph nodes: Cervical, supraclavicular, and axillary nodes normal. Neurologic: Grossly normal Flowsheet Row Office Visit from 09/05/2021 in [facility]  PHQ-2 Total Score 0      Assessment/ Plan: Timothy Torres here for annual physical exam.   Annual physical exam  Mixed hyperlipidemia - Plan: Hepatic function panel, Lipid panel  Depression with anxiety  Chronic nausea - Plan: ondansetron (ZOFRAN-ODT) 4 MG disintegrating tablet  Motion sickness, initial encounter - Plan: scopolamine (TRANSDERM-SCOP) 1 MG/3DAYS, ondansetron (ZOFRAN-ODT) 4 MG disintegrating tablet, meclizine (ANTIVERT) 25 MG tablet  We will need hepatitis C screening with next set of labs.  Fasting lipid and hepatic function panel ordered given recent initiation of Tricor.  Patient is fasting  Depression and anxiety are stable with Lexapro.  Zofran sent for chronic nausea.  Physical exam was remarkable for epigastric tenderness to palpation as well as left upper and left lower quadrant abdominal pain.  He has endoscopy and colonoscopy scheduled today.  Anticipate they will find gastritis versus peptic ulcer and may be even a little colitis given both upper and lower quadrant pain.  Hopefully they can get to the bottom of what is going on that he can start feeling better.  I  have given him both scopolamine and meclizine if needed for motion sickness.  We discussed that these can be sedating and drying.  Use with caution.  Hydrate well.  Follow-up in 6 months for recheck, sooner if concerns arise  Timothy Torres M. Lajuana Ripple, DO

## 2021-09-06 LAB — HEPATIC FUNCTION PANEL
ALT: 43 IU/L (ref 0–44)
AST: 33 IU/L (ref 0–40)
Albumin: 5.1 g/dL — ABNORMAL HIGH (ref 4.0–5.0)
Alkaline Phosphatase: 66 IU/L (ref 44–121)
Bilirubin Total: 0.5 mg/dL (ref 0.0–1.2)
Bilirubin, Direct: 0.13 mg/dL (ref 0.00–0.40)
Total Protein: 7.6 g/dL (ref 6.0–8.5)

## 2021-09-06 LAB — LIPID PANEL
Chol/HDL Ratio: 6 ratio — ABNORMAL HIGH (ref 0.0–5.0)
Cholesterol, Total: 240 mg/dL — ABNORMAL HIGH (ref 100–199)
HDL: 40 mg/dL (ref >39)
LDL Chol Calc (NIH): 128 mg/dL — ABNORMAL HIGH (ref 0–99)
Triglycerides: 400 mg/dL — ABNORMAL HIGH (ref 0–149)
VLDL Cholesterol Cal: 72 mg/dL — ABNORMAL HIGH (ref 5–40)

## 2021-09-07 ENCOUNTER — Encounter: Payer: Managed Care, Other (non HMO) | Admitting: Family Medicine

## 2021-09-17 NOTE — Telephone Encounter (Signed)
Encounter closed, patient came for appointment on 09/05/21.

## 2021-10-17 ENCOUNTER — Other Ambulatory Visit: Payer: Self-pay | Admitting: Family Medicine

## 2021-10-17 DIAGNOSIS — F418 Other specified anxiety disorders: Secondary | ICD-10-CM

## 2022-02-12 ENCOUNTER — Other Ambulatory Visit: Payer: Self-pay | Admitting: Family Medicine

## 2022-02-12 DIAGNOSIS — F418 Other specified anxiety disorders: Secondary | ICD-10-CM

## 2022-05-20 ENCOUNTER — Other Ambulatory Visit: Payer: Self-pay | Admitting: Family Medicine

## 2022-05-20 DIAGNOSIS — F418 Other specified anxiety disorders: Secondary | ICD-10-CM

## 2022-08-09 ENCOUNTER — Other Ambulatory Visit: Payer: Self-pay | Admitting: Family Medicine

## 2022-08-09 DIAGNOSIS — F418 Other specified anxiety disorders: Secondary | ICD-10-CM

## 2022-08-09 NOTE — Telephone Encounter (Signed)
[place] NTBS 30 days given 05/20/22

## 2022-08-12 ENCOUNTER — Encounter: Payer: Self-pay | Admitting: Family Medicine

## 2022-08-12 NOTE — Telephone Encounter (Signed)
LMTCB to schedule CPE with Dr. Darnell Level.  Letter mailed

## 2022-08-16 ENCOUNTER — Telehealth: Payer: Self-pay | Admitting: Family Medicine

## 2022-08-16 DIAGNOSIS — F418 Other specified anxiety disorders: Secondary | ICD-10-CM

## 2022-08-16 MED ORDER — ESCITALOPRAM OXALATE 20 MG PO TABS: 20.0000 mg | ORAL_TABLET | Freq: Every day | ORAL | 0 refills | Status: DC

## 2022-08-16 NOTE — Telephone Encounter (Signed)
  Prescription Request  08/16/2022  Is this a "Controlled Substance" medicine?   Have you seen your PCP in the last 2 weeks? Appt made for 3/11  If YES, route message to pool  -  If NO, patient needs to be scheduled for appointment.  What is the name of the medication or equipment? escitalopram (LEXAPRO) 20 MG tablet  Have you contacted your pharmacy to request a refill? Yes    Which pharmacy would you like this sent to? [facility] in [place]    Patient notified that their request is being sent to the clinical staff for review and that they should receive a response within 2 business days.

## 2022-08-16 NOTE — Telephone Encounter (Signed)
Pt aware refill sent to [facility]

## 2022-09-30 ENCOUNTER — Encounter: Payer: Self-pay | Admitting: Family Medicine

## 2022-09-30 ENCOUNTER — Ambulatory Visit (INDEPENDENT_AMBULATORY_CARE_PROVIDER_SITE_OTHER): Payer: Managed Care, Other (non HMO) | Admitting: Family Medicine

## 2022-09-30 VITALS — BP 134/80 | HR 82 | Temp 98.7°F | Ht 67.0 in | Wt 236.2 lb

## 2022-09-30 DIAGNOSIS — Z130 Encounter for screening for diseases of the blood and blood-forming organs and certain disorders involving the immune mechanism: Secondary | ICD-10-CM

## 2022-09-30 DIAGNOSIS — Z0000 Encounter for general adult medical examination without abnormal findings: Secondary | ICD-10-CM

## 2022-09-30 DIAGNOSIS — Z0001 Encounter for general adult medical examination with abnormal findings: Secondary | ICD-10-CM | POA: Diagnosis not present

## 2022-09-30 DIAGNOSIS — Z125 Encounter for screening for malignant neoplasm of prostate: Secondary | ICD-10-CM

## 2022-09-30 DIAGNOSIS — R5382 Chronic fatigue, unspecified: Secondary | ICD-10-CM | POA: Diagnosis not present

## 2022-09-30 DIAGNOSIS — E782 Mixed hyperlipidemia: Secondary | ICD-10-CM

## 2022-09-30 DIAGNOSIS — Z1159 Encounter for screening for other viral diseases: Secondary | ICD-10-CM

## 2022-09-30 DIAGNOSIS — K219 Gastro-esophageal reflux disease without esophagitis: Secondary | ICD-10-CM

## 2022-09-30 DIAGNOSIS — F418 Other specified anxiety disorders: Secondary | ICD-10-CM

## 2022-09-30 DIAGNOSIS — R635 Abnormal weight gain: Secondary | ICD-10-CM

## 2022-09-30 LAB — BAYER DCA HB A1C WAIVED: HB A1C (BAYER DCA - WAIVED): 5.3 % (ref 4.8–5.6)

## 2022-09-30 MED ORDER — OMEPRAZOLE 40 MG PO CPDR: 40.0000 mg | DELAYED_RELEASE_CAPSULE | Freq: Every day | ORAL | 3 refills | Status: DC

## 2022-09-30 MED ORDER — ESCITALOPRAM OXALATE 20 MG PO TABS: 20.0000 mg | ORAL_TABLET | Freq: Every day | ORAL | 3 refills | Status: DC

## 2022-09-30 MED ORDER — FENOFIBRATE 145 MG PO TABS: 145.0000 mg | ORAL_TABLET | Freq: Every day | ORAL | 3 refills | Status: DC

## 2022-09-30 NOTE — Progress Notes (Signed)
Timothy Torres is a 47 y.o. male presents to office today for annual physical exam examination.    Concerns today include: 1. Fatigue Patient suffering from chronic fatigue.  He notes that this is despite what seems like adequate sleep at nighttime.  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  He admits to weight gain over the last year and does not follow a strict diet.  No structured exercise reported.  Substance use: Never tobacco, alcohol or drug use Last eye exam: UTD Last dental exam: UTD Last colonoscopy: UTD Refills needed today: all Immunizations needed: Immunization History  Administered Date(s) Administered   Moderna Sars-Covid-2 Vaccination 05/19/2020, 06/18/2020   Tdap 11/01/2014     Past Medical History:  Diagnosis Date   Anxiety    Complication of anesthesia    hard time waking up, n and v   PONV (postoperative nausea and vomiting)    Social History   Socioeconomic History   Marital status: Married    Spouse name: Not on file   Number of children: Not on file   Years of education: Not on file   Highest education level: Not on file  Occupational History   Not on file  Tobacco Use   Smoking status: Never   Smokeless tobacco: Never  Vaping Use   Vaping Use: Never used  Substance and Sexual Activity   Alcohol use: Not Currently   Drug use: No   Sexual activity: Not on file  Other Topics Concern   Not on file  Social History Narrative   Not on file   Social Determinants of Health   Financial Resource Strain: Not on file  Food Insecurity: Not on file  Transportation Needs: Not on file  Physical Activity: Not on file  Stress: Not on file  Social Connections: Not on file  Intimate Partner Violence: Not on file   Past Surgical History:  Procedure Laterality Date   APPENDECTOMY     LAPAROSCOPIC APPENDECTOMY  04/15/2012   Procedure: APPENDECTOMY LAPAROSCOPIC;  Surgeon: Adin Hector, MD;  Location: [facility];  Service:  General;  Laterality: N/A;   WISDOM TOOTH EXTRACTION     Family History  Problem Relation Age of Onset   Hypertension Mother    Thyroid disease Mother    Depression Mother    Hypertension Father    Thyroid disease Father    Heart disease Maternal Grandfather    Stroke Paternal Grandfather    Depression Sister    Cancer Neg Hx    Diabetes Neg Hx    Colon cancer Neg Hx     Current Outpatient Medications:    escitalopram (LEXAPRO) 20 MG tablet, Take 1 tablet (20 mg total) by mouth daily., Disp: 180 tablet, Rfl: 0   fenofibrate (TRICOR) 145 MG tablet, Take 1 tablet (145 mg total) by mouth daily., Disp: 90 tablet, Rfl: 3   meclizine (ANTIVERT) 25 MG tablet, Take 1 tablet (25 mg total) by mouth 3 (three) times daily as needed for dizziness., Disp: 30 tablet, Rfl: 0   ondansetron (ZOFRAN-ODT) 4 MG disintegrating tablet, Take 1 tablet (4 mg total) by mouth every 8 (eight) hours as needed for nausea or vomiting., Disp: 20 tablet, Rfl: 0   scopolamine (TRANSDERM-SCOP) 1 MG/3DAYS, Place 1 patch (1.5 mg total) onto the skin every 3 (three) days., Disp: 8 patch, Rfl: 0  No Known Allergies   ROS: Review of Systems A comprehensive review of systems was negative except for: Constitutional:  positive for fatigue Gastrointestinal: positive for nausea    Physical exam BP 134/80   Pulse 82   Temp 98.7 F (37.1 C)   Ht '5\' 7"'$  (1.702 m)   Wt 236 lb 3.2 oz (107.1 kg)   SpO2 95%   BMI 36.99 kg/m  General appearance: alert, cooperative, appears stated age, no distress, and morbidly obese Head: Normocephalic, without obvious abnormality, atraumatic Eyes: negative findings: lids and lashes normal, conjunctivae and sclerae normal, corneas clear, and pupils equal, round, reactive to light and accomodation Ears: normal TM's and external ear canals both ears Nose: Nares normal. Septum midline. Mucosa normal. No drainage or sinus tenderness. Throat: lips, mucosa, and tongue normal; teeth and gums  normal Neck: no adenopathy, no carotid bruit, supple, symmetrical, trachea midline, and thyroid not enlarged, symmetric, no tenderness/mass/nodules; girth 18.5" Back: symmetric, no curvature. ROM normal. No CVA tenderness. Lungs: clear to auscultation bilaterally Chest wall: no tenderness Heart: regular rate and rhythm, S1, S2 normal, no murmur, click, rub or gallop Abdomen: soft, non-tender; bowel sounds normal; no masses,  no organomegaly Extremities: extremities normal, atraumatic, no cyanosis or edema Pulses: 2+ and symmetric Skin: Skin color, texture, turgor normal. No rashes or lesions Lymph nodes: Cervical, supraclavicular, and axillary nodes normal. Neurologic: Grossly normal    09/30/2022    1:53 PM 09/05/2021    9:06 AM 09/04/2020    3:21 PM  Depression screen PHQ 2/9  Decreased Interest 0 0 0  Down, Depressed, Hopeless 0 0 0  PHQ - 2 Score 0 0 0  Altered sleeping 1 1 0  Tired, decreased energy 1 2 0  Change in appetite 1 2 0  Feeling bad or failure about yourself  0 0 0  Trouble concentrating 0 0 0  Moving slowly or fidgety/restless 0 0 0  Suicidal thoughts 0 0 0  PHQ-9 Score 3 5 0  Difficult doing work/chores Not difficult at all Not difficult at all       09/30/2022    1:53 PM 09/05/2021    9:06 AM 07/03/2020    2:57 PM 04/30/2019    8:57 AM  GAD 7 : Generalized Anxiety Score  Nervous, Anxious, on Edge 0 1 3 0  Control/stop worrying 0 0 1 0  Worry too much - different things 0 0 1 1  Trouble relaxing 0 2 0 1  Restless 1 0 0 0  Easily annoyed or irritable '1 1 1 1  '$ Afraid - awful might happen 1 0 0 0  Total GAD 7 Score '3 4 6 3  '$ Anxiety Difficulty Somewhat difficult Not difficult at all Not difficult at all Somewhat difficult    Assessment/ Plan: Almond Lint here for annual physical exam.   Annual physical exam  Mixed hyperlipidemia - Plan: CMP14+EGFR, Lipid panel, TSH, TSH, Lipid panel, CMP14+EGFR, fenofibrate (TRICOR) 145 MG tablet  Depression with  anxiety - Plan: TSH, TSH, escitalopram (LEXAPRO) 20 MG tablet  Morbid obesity (HCC) - Plan: Bayer DCA Hb A1c Waived, Bayer DCA Hb A1c Waived, Ambulatory referral to Sleep Studies  Screening for malignant neoplasm of prostate - Plan: PSA, PSA  Screening, anemia, deficiency, iron - Plan: CBC, CBC  Encounter for hepatitis C screening test for low risk patient - Plan: Hepatitis C antibody, Hepatitis C antibody  Chronic fatigue - Plan: Testosterone, Vitamin B12, Ambulatory referral to Sleep Studies  Weight gain - Plan: Testosterone, Ambulatory referral to Sleep Studies  Gastroesophageal reflux disease without esophagitis - Plan: omeprazole (PRILOSEC) 40 MG  capsule  Nonfasting labs collected today.  Medications have been renewed.  Mood is stable with Lexapro.  This been renewed  Has had about a 10 pound weight gain since last visit and now with chronic fatigue.  Question OSA.  STOP-BANG score of 4.  Referral for home sleep study placed.  He will also come in for testosterone and B12 level at a future date.  Will plan to add fasting lipid for 9-monthfollow-up given reinitiation of Tricor  GERD is stable with Prilosec but unfortunately continues to have chronic nausea that no one has been able to determine etiology of despite esophageal stretch and treatment of peptic ulcers.  He simply is living with this  Counseled on healthy lifestyle choices, including diet (rich in fruits, vegetables and lean meats and low in salt and simple carbohydrates) and exercise (at least 30 minutes of moderate physical activity daily).  Patient to follow up in 1 year for annual exam or sooner if needed.  Marsela Kuan M. GLajuana Ripple DO

## 2022-09-30 NOTE — Patient Instructions (Addendum)
Sleep Apnea Sleep apnea is a condition in which breathing pauses or becomes shallow during sleep. People with sleep apnea usually snore loudly. They may have times when they gasp and stop breathing for 10 seconds or more during sleep. This may happen many times during the night. Sleep apnea disrupts your sleep and keeps your body from getting the rest that it needs. This condition can increase your risk of certain health problems, including: Heart attack. Stroke. Obesity. Type 2 diabetes. Heart failure. Irregular heartbeat. High blood pressure. The goal of treatment is to help you breathe normally again. What are the causes?  The most common cause of sleep apnea is a collapsed or blocked airway. There are three kinds of sleep apnea: Obstructive sleep apnea. This kind is caused by a blocked or collapsed airway. Central sleep apnea. This kind happens when the part of the brain that controls breathing does not send the correct signals to the muscles that control breathing. Mixed sleep apnea. This is a combination of obstructive and central sleep apnea. What increases the risk? You are more likely to develop this condition if you: Are overweight. Smoke. Have a smaller than normal airway. Are older. Are male. Drink alcohol. Take sedatives or tranquilizers. Have a family history of sleep apnea. Have a tongue or tonsils that are larger than normal. What are the signs or symptoms? Symptoms of this condition include: Trouble staying asleep. Loud snoring. Morning headaches. Waking up gasping. Dry mouth or sore throat in the morning. Daytime sleepiness and tiredness. If you have daytime fatigue because of sleep apnea, you may be more likely to have: Trouble concentrating. Forgetfulness. Irritability or mood swings. Personality changes. Feelings of depression. Sexual dysfunction. This may include loss of interest if you are male, or erectile dysfunction if you are male. How is this  diagnosed? This condition may be diagnosed with: A medical history. A physical exam. A series of tests that are done while you are sleeping (sleep study). These tests are usually done in a sleep lab, but they may also be done at home. How is this treated? Treatment for this condition aims to restore normal breathing and to ease symptoms during sleep. It may involve managing health issues that can affect breathing, such as high blood pressure or obesity. Treatment may include: Sleeping on your side. Using a decongestant if you have nasal congestion. Avoiding the use of depressants, including alcohol, sedatives, and narcotics. Losing weight if you are overweight. Making changes to your diet. Quitting smoking. Using a device to open your airway while you sleep, such as: An oral appliance. This is a custom-made mouthpiece that shifts your lower jaw forward. A continuous positive airway pressure (CPAP) device. This device blows air through a mask when you breathe out (exhale). A nasal expiratory positive airway pressure (EPAP) device. This device has valves that you put into each nostril. A bi-level positive airway pressure (BIPAP) device. This device blows air through a mask when you breathe in (inhale) and breathe out (exhale). Having surgery if other treatments do not work. During surgery, excess tissue is removed to create a wider airway. Follow these instructions at home: Lifestyle Make any lifestyle changes that your health care provider recommends. Eat a healthy, well-balanced diet. Take steps to lose weight if you are overweight. Avoid using depressants, including alcohol, sedatives, and narcotics. Do not use any products that contain nicotine or tobacco. These products include cigarettes, chewing tobacco, and vaping devices, such as e-cigarettes. If you need help quitting, ask   your health care provider. General instructions Take over-the-counter and prescription medicines only as told  by your health care provider. If you were given a device to open your airway while you sleep, use it only as told by your health care provider. If you are having surgery, make sure to tell your health care provider you have sleep apnea. You may need to bring your device with you. Keep all follow-up visits. This is important. Contact a health care provider if: The device that you received to open your airway during sleep is uncomfortable or does not seem to be working. Your symptoms do not improve. Your symptoms get worse. Get help right away if: You develop: Chest pain. Shortness of breath. Discomfort in your back, arms, or stomach. You have: Trouble speaking. Weakness on one side of your body. Drooping in your face. These symptoms may represent a serious problem that is an emergency. Do not wait to see if the symptoms will go away. Get medical help right away. Call your local emergency services (911 in the [place] Do not drive yourself to the hospital. Summary Sleep apnea is a condition in which breathing pauses or becomes shallow during sleep. The most common cause is a collapsed or blocked airway. The goal of treatment is to restore normal breathing and to ease symptoms during sleep. This information is not intended to replace advice given to you by your health care provider. Make sure you discuss any questions you have with your health care provider. Document Revised: 02/14/2021 Document Reviewed: 06/16/2020 Elsevier Patient Education  2023 [employer]  

## 2022-10-01 LAB — CMP14+EGFR
ALT: 36 IU/L (ref 0–44)
AST: 23 IU/L (ref 0–40)
Albumin/Globulin Ratio: 1.9 (ref 1.2–2.2)
Albumin: 4.8 g/dL (ref 4.1–5.1)
Alkaline Phosphatase: 59 IU/L (ref 44–121)
BUN/Creatinine Ratio: 12 (ref 9–20)
BUN: 12 mg/dL (ref 6–24)
Bilirubin Total: 0.3 mg/dL (ref 0.0–1.2)
CO2: 22 mmol/L (ref 20–29)
Calcium: 9.6 mg/dL (ref 8.7–10.2)
Chloride: 104 mmol/L (ref 96–106)
Creatinine, Ser: 1.03 mg/dL (ref 0.76–1.27)
Globulin, Total: 2.5 g/dL (ref 1.5–4.5)
Glucose: 87 mg/dL (ref 70–99)
Potassium: 4.5 mmol/L (ref 3.5–5.2)
Sodium: 143 mmol/L (ref 134–144)
Total Protein: 7.3 g/dL (ref 6.0–8.5)
eGFR: 92 mL/min/{1.73_m2} (ref >59)

## 2022-10-01 LAB — CBC
Hematocrit: 44.7 % (ref 37.5–51.0)
Hemoglobin: 15.1 g/dL (ref 13.0–17.7)
MCH: 31.7 pg (ref 26.6–33.0)
MCHC: 33.8 g/dL (ref 31.5–35.7)
MCV: 94 fL (ref 79–97)
Platelets: 260 10*3/uL (ref 150–450)
RBC: 4.77 x10E6/uL (ref 4.14–5.80)
RDW: 13.4 % (ref 11.6–15.4)
WBC: 6.7 10*3/uL (ref 3.4–10.8)

## 2022-10-01 LAB — LIPID PANEL
Chol/HDL Ratio: 5 ratio (ref 0.0–5.0)
Cholesterol, Total: 208 mg/dL — ABNORMAL HIGH (ref 100–199)
HDL: 42 mg/dL (ref >39)
LDL Chol Calc (NIH): 105 mg/dL — ABNORMAL HIGH (ref 0–99)
Triglycerides: 360 mg/dL — ABNORMAL HIGH (ref 0–149)
VLDL Cholesterol Cal: 61 mg/dL — ABNORMAL HIGH (ref 5–40)

## 2022-10-01 LAB — TSH: TSH: 3.41 u[IU]/mL (ref 0.450–4.500)

## 2022-10-01 LAB — HEPATITIS C ANTIBODY: Hep C Virus Ab: NONREACTIVE

## 2022-10-01 LAB — PSA: Prostate Specific Ag, Serum: 0.4 ng/mL (ref 0.0–4.0)

## 2023-10-01 ENCOUNTER — Encounter: Payer: Self-pay | Admitting: Family Medicine

## 2023-10-01 ENCOUNTER — Ambulatory Visit: Payer: Managed Care, Other (non HMO) | Admitting: Family Medicine

## 2023-10-01 VITALS — BP 138/86 | HR 77 | Temp 98.3°F | Ht 67.0 in | Wt 240.0 lb

## 2023-10-01 DIAGNOSIS — R1319 Other dysphagia: Secondary | ICD-10-CM

## 2023-10-01 DIAGNOSIS — K449 Diaphragmatic hernia without obstruction or gangrene: Secondary | ICD-10-CM | POA: Diagnosis not present

## 2023-10-01 DIAGNOSIS — Z0001 Encounter for general adult medical examination with abnormal findings: Secondary | ICD-10-CM | POA: Diagnosis not present

## 2023-10-01 DIAGNOSIS — G4733 Obstructive sleep apnea (adult) (pediatric): Secondary | ICD-10-CM | POA: Diagnosis not present

## 2023-10-01 DIAGNOSIS — E782 Mixed hyperlipidemia: Secondary | ICD-10-CM

## 2023-10-01 DIAGNOSIS — Z8249 Family history of ischemic heart disease and other diseases of the circulatory system: Secondary | ICD-10-CM

## 2023-10-01 DIAGNOSIS — Z6837 Body mass index (BMI) 37.0-37.9, adult: Secondary | ICD-10-CM

## 2023-10-01 DIAGNOSIS — K219 Gastro-esophageal reflux disease without esophagitis: Secondary | ICD-10-CM

## 2023-10-01 DIAGNOSIS — Z125 Encounter for screening for malignant neoplasm of prostate: Secondary | ICD-10-CM

## 2023-10-01 DIAGNOSIS — F418 Other specified anxiety disorders: Secondary | ICD-10-CM

## 2023-10-01 DIAGNOSIS — Z0000 Encounter for general adult medical examination without abnormal findings: Secondary | ICD-10-CM

## 2023-10-01 LAB — LIPID PANEL

## 2023-10-01 LAB — BAYER DCA HB A1C WAIVED: HB A1C (BAYER DCA - WAIVED): 5.5 % (ref 4.8–5.6)

## 2023-10-01 MED ORDER — ESCITALOPRAM OXALATE 20 MG PO TABS: 20.0000 mg | ORAL_TABLET | Freq: Every day | ORAL | 3 refills | Status: AC

## 2023-10-01 MED ORDER — OMEPRAZOLE 40 MG PO CPDR: 40.0000 mg | DELAYED_RELEASE_CAPSULE | Freq: Every day | ORAL | 3 refills | Status: AC

## 2023-10-01 MED ORDER — FENOFIBRATE 145 MG PO TABS: 145.0000 mg | ORAL_TABLET | Freq: Every day | ORAL | 3 refills | Status: AC

## 2023-10-01 NOTE — Progress Notes (Signed)
 Timothy Torres is a 48 y.o. male presents to office today for annual physical exam examination.    Concerns today include:  1.  Dysphagia Patient has been having ongoing issues with dysphagia.  He has seen 2 different gastroenterologist, 1 with the goal and wanting [facility].  He had esophageal stretching done at [facility] and when symptoms did not resolve he proceeded with second opinion at the gastroenterologist in [place].  They did a swallow study which showed a moderate sliding hiatal hernia.  When he followed up with the gastroenterologist in [place] they again recommended stretching his esophagus again but he does not feel that this was appropriate given that this had just been done a few weeks prior.  He has not followed back up with gastroenterology but notes that symptoms are progressive and he continues to have trouble swallowing, eating and sometimes even breathing.  He reports worsening hoarseness and pressure sometimes in his throat.  He is compliant with Prilosec.  Denies any blood in stool.  2.  Morbid obesity Has known obstructive sleep apnea and this is classified as severe.  He is interested in getting help in reducing weight to reduce complications associated with weight.  He reports no personal history of pancreatitis, family history of medullary thyroid cancer or multiple endocrine type II neoplasia.  He is compliant with CPAP machine but does not enjoy using it.  Takes Tricor for cholesterol.  Mood is stable with Lexapro  Marital status: married currently fostering a set of twins, Substance use: none There are no preventive care reminders to display for this patient. Refills needed today: all  Immunization History  Administered Date(s) Administered   Moderna Sars-Covid-2 Vaccination 05/19/2020, 06/18/2020   Tdap 11/01/2014   Past Medical History:  Diagnosis Date   Anxiety    Complication of anesthesia    hard time waking up, n and v   PONV (postoperative  nausea and vomiting)    Social History   Socioeconomic History   Marital status: Married    Spouse name: Not on file   Number of children: Not on file   Years of education: Not on file   Highest education level: Not on file  Occupational History   Not on file  Tobacco Use   Smoking status: Never   Smokeless tobacco: Never  Vaping Use   Vaping status: Never Used  Substance and Sexual Activity   Alcohol use: Not Currently   Drug use: No   Sexual activity: Not on file  Other Topics Concern   Not on file  Social History Narrative   Not on file   Social Drivers of Health   Financial Resource Strain: High Risk (10/01/2023)   Overall Financial Resource Strain (CARDIA)    Difficulty of Paying Living Expenses: Very hard  Food Insecurity: No Food Insecurity (10/01/2023)   Hunger Vital Sign    Worried About Running Out of Food in the Last Year: Never true    Ran Out of Food in the Last Year: Never true  Transportation Needs: No Transportation Needs (10/01/2023)   PRAPARE - Administrator, Civil Service (Medical): No    Lack of Transportation (Non-Medical): No  Physical Activity: Inactive (10/01/2023)   Exercise Vital Sign    Days of Exercise per Week: 0 days    Minutes of Exercise per Session: 0 min  Stress: No Stress Concern Present (10/01/2023)   [employer] of Occupational Health - Occupational Stress Questionnaire    Feeling of  Stress : Not at all  Social Connections: Socially Integrated (10/01/2023)   Social Connection and Isolation Panel [NHANES]    Frequency of Communication with Friends and Family: More than three times a week    Frequency of Social Gatherings with Friends and Family: More than three times a week    Attends Religious Services: More than 4 times per year    Active Member of [employer] or Organizations: Yes    Attends Banker Meetings: More than 4 times per year    Marital Status: Married  Catering manager Violence: Not At Risk  (10/01/2023)   Humiliation, Afraid, Rape, and Kick questionnaire    Fear of Current or Ex-Partner: No    Emotionally Abused: No    Physically Abused: No    Sexually Abused: No   Past Surgical History:  Procedure Laterality Date   APPENDECTOMY     LAPAROSCOPIC APPENDECTOMY  04/15/2012   Procedure: APPENDECTOMY LAPAROSCOPIC;  Surgeon: Ardeth Sportsman, MD;  Location: [facility];  Service: General;  Laterality: N/A;   WISDOM TOOTH EXTRACTION     Family History  Problem Relation Age of Onset   Hypertension Mother    Thyroid disease Mother    Depression Mother    Hypertension Father    Thyroid disease Father    Heart disease Maternal Grandfather    Stroke Paternal Grandfather    Depression Sister    Cancer Neg Hx    Diabetes Neg Hx    Colon cancer Neg Hx     Current Outpatient Medications:    escitalopram (LEXAPRO) 20 MG tablet, Take 1 tablet (20 mg total) by mouth daily., Disp: 90 tablet, Rfl: 3   fenofibrate (TRICOR) 145 MG tablet, Take 1 tablet (145 mg total) by mouth daily., Disp: 90 tablet, Rfl: 3   omeprazole (PRILOSEC) 40 MG capsule, Take 1 capsule (40 mg total) by mouth daily., Disp: 90 capsule, Rfl: 3  No Known Allergies   ROS: Review of Systems Pertinent items noted in HPI and remainder of comprehensive ROS otherwise negative.    Physical exam BP 138/86   Pulse 77   Temp 98.3 F (36.8 C)   Ht 5\' 7"  (1.702 m)   Wt 240 lb (108.9 kg)   SpO2 96%   BMI 37.59 kg/m  General appearance: alert, cooperative, appears stated age, no distress, and morbidly obese Head: Normocephalic, without obvious abnormality, atraumatic Eyes: negative findings: lids and lashes normal, conjunctivae and sclerae normal, corneas clear, and pupils equal, round, reactive to light and accomodation Ears: normal TM's and external ear canals both ears Nose: Nares normal. Septum midline. Mucosa normal. No drainage or sinus tenderness. Throat: lips, mucosa, and tongue normal; teeth and gums  normal Neck: no adenopathy, supple, symmetrical, trachea midline, and thyroid not enlarged, symmetric, no tenderness/mass/nodules Back: symmetric, no curvature. ROM normal. No CVA tenderness. Lungs: clear to auscultation bilaterally Chest wall: no tenderness Heart: regular rate and rhythm, S1, S2 normal, no murmur, click, rub or gallop Abdomen: soft, non-tender; bowel sounds normal; no masses,  no organomegaly and obese Extremities: extremities normal, atraumatic, no cyanosis or edema Pulses: 2+ and symmetric Skin: Skin color, texture, turgor normal. No rashes or lesions Lymph nodes: Cervical, supraclavicular, and axillary nodes normal. Neurologic: Grossly normal      10/01/2023    9:00 AM 09/30/2022    1:53 PM 09/05/2021    9:06 AM  Depression screen PHQ 2/9  Decreased Interest 0 0 0  Down, Depressed, Hopeless 0 0 0  PHQ - 2 Score 0 0 0  Altered sleeping 0 1 1  Tired, decreased energy 0 1 2  Change in appetite 0 1 2  Feeling bad or failure about yourself  0 0 0  Trouble concentrating 0 0 0  Moving slowly or fidgety/restless 0 0 0  Suicidal thoughts 0 0 0  PHQ-9 Score 0 3 5  Difficult doing work/chores Not difficult at all Not difficult at all Not difficult at all      10/01/2023    9:05 AM 10/01/2023    9:00 AM 09/30/2022    1:53 PM 09/05/2021    9:06 AM  GAD 7 : Generalized Anxiety Score  Nervous, Anxious, on Edge 1 0 0 1  Control/stop worrying 0 0 0 0  Worry too much - different things 0 0 0 0  Trouble relaxing 1 0 0 2  Restless 1 0 1 0  Easily annoyed or irritable 1 0 1 1  Afraid - awful might happen 0 0 1 0  Total GAD 7 Score 4 0 3 4  Anxiety Difficulty  Not difficult at all Somewhat difficult Not difficult at all     Assessment/ Plan: Timothy Torres here for annual physical exam.   Annual physical exam  Morbid obesity (HCC) - Plan: CT CARDIAC SCORING (SELF PAY ONLY), CMP14+EGFR, Lipid Panel, Bayer DCA Hb A1c Waived, Insulin, random, VITAMIN D 25 Hydroxy  (Vit-D Deficiency, Fractures)  BMI 37.0-37.9, adult - Plan: CT CARDIAC SCORING (SELF PAY ONLY), CMP14+EGFR, Lipid Panel, Bayer DCA Hb A1c Waived, Insulin, random, VITAMIN D 25 Hydroxy (Vit-D Deficiency, Fractures)  Severe obstructive sleep apnea - Plan: CT CARDIAC SCORING (SELF PAY ONLY), CMP14+EGFR, CBC  Sliding hiatal hernia - Plan: CMP14+EGFR, CBC, Ambulatory referral to Gastroenterology  Esophageal dysphagia - Plan: CMP14+EGFR, CBC, Ambulatory referral to Gastroenterology  Mixed hyperlipidemia - Plan: CT CARDIAC SCORING (SELF PAY ONLY), CMP14+EGFR, Lipid Panel, TSH, fenofibrate (TRICOR) 145 MG tablet  Family history of heart disease - Plan: CT CARDIAC SCORING (SELF PAY ONLY), CMP14+EGFR  Depression with anxiety - Plan: CMP14+EGFR, escitalopram (LEXAPRO) 20 MG tablet  Screening for malignant neoplasm of prostate - Plan: PSA  Gastroesophageal reflux disease without esophagitis - Plan: omeprazole (PRILOSEC) 40 MG capsule  We discussed risks versus benefits of GIP treatment.  No apparent contraindications to utilization of medicine.  He will contact insurance to see if they are covering this for obstructive sleep apnea, which she has a severe case of.  I have also placed CT coronary artery calcium scoring given hyperlipidemia and risk factors.  Referral back to gastroenterology for esophageal dysphagia which seems to be compounded by sliding hiatal hernia.  I wonder if he is a candidate for Nissen fundoplication.  I worry about aspiration given frequent regurgitation of food and inability to swallow fibrous foods  Fasting labs were collected today.  He will continue Tricor for now.  May need to consider visiting statin pending CT coronary artery calcium scoring  Continue Lexapro for depression with anxiety  Screening PSA collected but he is asymptomatic  Counseled on healthy lifestyle choices, including diet (rich in fruits, vegetables and lean meats and low in salt and simple  carbohydrates) and exercise (at least 30 minutes of moderate physical activity daily).  Patient to follow up  3-54m if starts Zepbound, otherwise 1 year  Timothy Thibault M. Nadine Counts, DO

## 2023-10-01 NOTE — Patient Instructions (Signed)
 Check with ins to see if Zepbound is approved under your plan for Obstructive sleep apnea/ weight loss.   Message me when you know and I will send in if they cover. If they do not yet, we can try again in the summer.  At some point all plans SHOULD cover the med. Call the GI doctor down at [facility] to set up a visit.  Surgery to Stop Reflux (Laparoscopic Nissen Fundoplication): What to Expect  Laparoscopic Nissen fundoplication is a surgery to help with symptoms of gastroesophageal reflux disease (GERD). In GERD, stomach contents and acid back up into your esophagus. Your esophagus is the part of your body that moves food from your mouth to your stomach. Normally, the muscle between your stomach and esophagus keeps stomach contents and acid in your stomach. If you have GERD, that muscle doesn't work like it should. This means stomach contents and acid may flow back up (reflux). You may need this surgery if other treatments for GERD haven't helped. In this surgery, the upper part of your stomach is wrapped around the lower end of your esophagus and stitched together. Tell a health care provider about: Any allergies you have. All medicines you take. These include vitamins, herbs, eye drops, and creams. Any problems you or family members have had with anesthesia. Any bleeding problems you have. Any surgeries you've had. Any medical problems you have. Whether you're pregnant or may be pregnant. What are the risks? Your health care provider will talk with you about risks. These may include: Infection. Bleeding or blood clots. Damage to other structures or organs. These include the stomach, esophagus, lungs, liver, and spleen. Trouble swallowing. Not being able to burp. This can cause bloating. Allergic reactions to medicines. Other risks may include: A tear in the stitches used for the wrap. A hole in your lung. A hole in your esophagus or stomach. What happens before? When to stop eating and  drinking Eat and drink only as you've been told. You may be told this: 8 hours before your surgery Stop eating most foods. Do not eat meat, fried foods, or fatty foods. Eat only light foods, such as toast or crackers. All liquids are OK except energy drinks and alcohol. 6 hours before your surgery Stop eating. Drink only clear liquids, such as water, clear fruit juice, black coffee, plain tea, and sports drinks. Do not drink energy drinks or alcohol. 2 hours before your surgery Stop drinking all liquids. You may be allowed to take medicines with small sips of water. If you do not eat and drink as told, your surgery may be delayed or canceled. Medicines Ask about changing or stopping: Any medicines you take. Any vitamins, herbs, or supplements you take. Do not take aspirin or ibuprofen unless you're told to. Tests You'll need tests. These may include: Barium swallow X-rays. These are done to take pictures of your throat, stomach, and small intestine. An exam using a scope put down your esophagus into your stomach. A test to measures the pressure in your throat when you swallow. A test using a probe to look for acid in your esophagus. Surgery safety For your safety, you may: Need to wash your skin with a soap that kills germs. Get antibiotics. Have your surgery site marked. Have hair removed at the surgery site. General instructions Do not smoke, vape, or use nicotine or tobacco for at least 4 weeks before the surgery. Ask if you'll be staying overnight in the hospital. If you'll be going  home right after the surgery, plan to have a responsible adult: Drive you home from the hospital or clinic. You won't be allowed to drive. Stay with you for the time you're told. What happens during laparoscopic Nissen fundoplication? An IV will be put into a vein in your hand or arm. You may be given: A sedative to help you relax. Anesthesia to keep you from feeling pain. A small cut will be  made in your belly. A thin, lighted tube called a laparoscope will be put into the cut. The tube has a camera on the end of it to help your surgeon see inside your stomach. A few more small cuts will be made in your belly. Tools will be put through the cuts. The upper part of your stomach will be wrapped and stitched around the lower end of your esophagus. This will strengthen the muscle and prevent reflux. If you have a hiatal hernia, it will also be fixed. A hernia is a bulge of tissue from inside your belly that pushes through a weak area of your belly. The tools will be taken out. The cuts will be closed with stitches. A bandage will be put over the cuts. These steps may vary. Ask what you can expect. What happens after? You'll be watched closely until you leave. This includes checking your pain level, blood pressure, heart rate, and breathing rate. Your IV will be kept in until you can drink on your own. This information is not intended to replace advice given to you by your health care provider. Make sure you discuss any questions you have with your health care provider. Document Revised: 02/27/2023 Document Reviewed: 02/27/2023 Elsevier Patient Education  2024 [employer].

## 2023-10-02 LAB — CBC
Hematocrit: 41.3 % (ref 37.5–51.0)
Hemoglobin: 14.1 g/dL (ref 13.0–17.7)
MCH: 31.8 pg (ref 26.6–33.0)
MCHC: 34.1 g/dL (ref 31.5–35.7)
MCV: 93 fL (ref 79–97)
Platelets: 295 10*3/uL (ref 150–450)
RBC: 4.44 x10E6/uL (ref 4.14–5.80)
RDW: 12.8 % (ref 11.6–15.4)
WBC: 6.1 10*3/uL (ref 3.4–10.8)

## 2023-10-02 LAB — PSA: Prostate Specific Ag, Serum: 0.5 ng/mL (ref 0.0–4.0)

## 2023-10-02 LAB — CMP14+EGFR
ALT: 30 IU/L (ref 0–44)
AST: 25 IU/L (ref 0–40)
Albumin: 4.3 g/dL (ref 4.1–5.1)
Alkaline Phosphatase: 50 IU/L (ref 44–121)
BUN/Creatinine Ratio: 11 (ref 9–20)
BUN: 11 mg/dL (ref 6–24)
Bilirubin Total: <0.2 mg/dL (ref 0.0–1.2)
CO2: 16 mmol/L — ABNORMAL LOW (ref 20–29)
Calcium: 8.9 mg/dL (ref 8.7–10.2)
Chloride: 107 mmol/L — ABNORMAL HIGH (ref 96–106)
Creatinine, Ser: 0.98 mg/dL (ref 0.76–1.27)
Globulin, Total: 2.6 g/dL (ref 1.5–4.5)
Glucose: 91 mg/dL (ref 70–99)
Potassium: 4.4 mmol/L (ref 3.5–5.2)
Sodium: 140 mmol/L (ref 134–144)
Total Protein: 6.9 g/dL (ref 6.0–8.5)
eGFR: 97 mL/min/{1.73_m2} (ref >59)

## 2023-10-02 LAB — INSULIN, RANDOM: INSULIN: 47.9 u[IU]/mL — ABNORMAL HIGH (ref 2.6–24.9)

## 2023-10-02 LAB — VITAMIN D 25 HYDROXY (VIT D DEFICIENCY, FRACTURES): Vit D, 25-Hydroxy: 14.5 ng/mL — ABNORMAL LOW (ref 30.0–100.0)

## 2023-10-02 LAB — LIPID PANEL
Cholesterol, Total: 167 mg/dL (ref 100–199)
HDL: 42 mg/dL (ref >39)
LDL CALC COMMENT:: 4 ratio (ref 0.0–5.0)
LDL Chol Calc (NIH): 96 mg/dL (ref 0–99)
Triglycerides: 168 mg/dL — ABNORMAL HIGH (ref 0–149)
VLDL Cholesterol Cal: 29 mg/dL (ref 5–40)

## 2023-10-02 LAB — TSH: TSH: 3.5 u[IU]/mL (ref 0.450–4.500)

## 2023-10-03 ENCOUNTER — Other Ambulatory Visit: Payer: Self-pay | Admitting: Family Medicine

## 2023-10-03 DIAGNOSIS — E559 Vitamin D deficiency, unspecified: Secondary | ICD-10-CM

## 2023-10-03 MED ORDER — VITAMIN D (ERGOCALCIFEROL) 1.25 MG (50000 UNIT) PO CAPS: 50000.0000 [IU] | ORAL_CAPSULE | ORAL | 3 refills | Status: AC

## 2023-11-21 ENCOUNTER — Ambulatory Visit (HOSPITAL_COMMUNITY)
Admission: RE | Admit: 2023-11-21 | Discharge: 2023-11-21 | Disposition: A | Discharge status: Home / Self Care | Payer: Self-pay | Source: Ambulatory Visit | Attending: Family Medicine | Admitting: Family Medicine

## 2023-11-21 DIAGNOSIS — Z6837 Body mass index (BMI) 37.0-37.9, adult: Secondary | ICD-10-CM | POA: Insufficient documentation

## 2023-11-21 DIAGNOSIS — G4733 Obstructive sleep apnea (adult) (pediatric): Secondary | ICD-10-CM | POA: Insufficient documentation

## 2023-11-21 DIAGNOSIS — Z8249 Family history of ischemic heart disease and other diseases of the circulatory system: Secondary | ICD-10-CM | POA: Insufficient documentation

## 2023-11-21 DIAGNOSIS — E782 Mixed hyperlipidemia: Secondary | ICD-10-CM | POA: Insufficient documentation

## 2023-11-24 ENCOUNTER — Encounter: Payer: Self-pay | Admitting: Family Medicine

## 2024-02-02 ENCOUNTER — Ambulatory Visit: Admitting: Family Medicine
